# Patient Record
Sex: Female | Born: 1960 | Race: White | Hispanic: No | Marital: Married | Smoking: Never smoker
Health system: Southern US, Community
[De-identification: ages and names within clinical notes are randomized; demographics above are authoritative.]

## PROBLEM LIST (undated history)

## (undated) DIAGNOSIS — R002 Palpitations: Secondary | ICD-10-CM

## (undated) DIAGNOSIS — G4700 Insomnia, unspecified: Secondary | ICD-10-CM

## (undated) DIAGNOSIS — R9430 Abnormal result of cardiovascular function study, unspecified: Secondary | ICD-10-CM

## (undated) DIAGNOSIS — F410 Panic disorder [episodic paroxysmal anxiety] without agoraphobia: Secondary | ICD-10-CM

## (undated) DIAGNOSIS — IMO0002 Reserved for concepts with insufficient information to code with codable children: Secondary | ICD-10-CM

## (undated) DIAGNOSIS — R011 Cardiac murmur, unspecified: Secondary | ICD-10-CM

## (undated) DIAGNOSIS — I879 Disorder of vein, unspecified: Secondary | ICD-10-CM

## (undated) DIAGNOSIS — Z8249 Family history of ischemic heart disease and other diseases of the circulatory system: Secondary | ICD-10-CM

## (undated) HISTORY — DX: Insomnia, unspecified: G47.00

## (undated) HISTORY — DX: Abnormal result of cardiovascular function study, unspecified: R94.30

## (undated) HISTORY — PX: APPENDECTOMY: SHX54

## (undated) HISTORY — DX: Reserved for concepts with insufficient information to code with codable children: IMO0002

## (undated) HISTORY — DX: Disorder of vein, unspecified: I87.9

## (undated) HISTORY — DX: Palpitations: R00.2

## (undated) HISTORY — DX: Family history of ischemic heart disease and other diseases of the circulatory system: Z82.49

## (undated) HISTORY — DX: Cardiac murmur, unspecified: R01.1

## (undated) HISTORY — DX: Panic disorder (episodic paroxysmal anxiety): F41.0

---
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
## 1993-04-30 HISTORY — PX: OTHER SURGICAL HISTORY: SHX169

## 2000-02-09 ENCOUNTER — Encounter: Payer: Self-pay | Admitting: Obstetrics and Gynecology

## 2000-02-09 ENCOUNTER — Encounter: Admission: RE | Admit: 2000-02-09 | Discharge: 2000-02-09 | Payer: Self-pay | Admitting: Obstetrics and Gynecology

## 2001-02-11 ENCOUNTER — Encounter: Admission: RE | Admit: 2001-02-11 | Discharge: 2001-02-11 | Payer: Self-pay | Admitting: Obstetrics and Gynecology

## 2001-02-11 ENCOUNTER — Encounter: Payer: Self-pay | Admitting: Obstetrics and Gynecology

## 2002-02-16 ENCOUNTER — Encounter: Admission: RE | Admit: 2002-02-16 | Discharge: 2002-02-16 | Payer: Self-pay | Admitting: Obstetrics and Gynecology

## 2002-02-16 ENCOUNTER — Encounter: Payer: Self-pay | Admitting: Obstetrics and Gynecology

## 2002-10-28 ENCOUNTER — Ambulatory Visit (HOSPITAL_COMMUNITY): Admission: RE | Admit: 2002-10-28 | Discharge: 2002-10-28 | Payer: Self-pay | Admitting: Gastroenterology

## 2003-02-24 ENCOUNTER — Encounter: Admission: RE | Admit: 2003-02-24 | Discharge: 2003-02-24 | Payer: Self-pay | Admitting: Obstetrics and Gynecology

## 2004-06-21 ENCOUNTER — Ambulatory Visit (HOSPITAL_COMMUNITY): Admission: RE | Admit: 2004-06-21 | Discharge: 2004-06-21 | Payer: Self-pay | Admitting: Obstetrics and Gynecology

## 2005-07-12 ENCOUNTER — Ambulatory Visit (HOSPITAL_COMMUNITY): Admission: RE | Admit: 2005-07-12 | Discharge: 2005-07-12 | Payer: Self-pay | Admitting: Obstetrics and Gynecology

## 2006-08-01 ENCOUNTER — Ambulatory Visit (HOSPITAL_COMMUNITY): Admission: RE | Admit: 2006-08-01 | Discharge: 2006-08-01 | Payer: Self-pay | Admitting: Obstetrics and Gynecology

## 2006-08-29 ENCOUNTER — Inpatient Hospital Stay (HOSPITAL_COMMUNITY): Admission: RE | Admit: 2006-08-29 | Discharge: 2006-09-02 | Payer: Self-pay | Admitting: Neurosurgery

## 2007-11-26 ENCOUNTER — Encounter: Admission: RE | Admit: 2007-11-26 | Discharge: 2007-11-26 | Payer: Self-pay | Admitting: Obstetrics and Gynecology

## 2010-07-21 ENCOUNTER — Telehealth: Payer: Self-pay | Admitting: *Deleted

## 2010-07-21 DIAGNOSIS — R002 Palpitations: Secondary | ICD-10-CM

## 2010-07-21 NOTE — Telephone Encounter (Signed)
Dr Myrtis Ser received a call from pts pcp stating she was having a lot of palpitations, he would like for pt to have an echo and see him in the office as a new pt.  Spoke w/pt appts sch, echo 4/3 at 4pm and w/Dr Myrtis Ser 4/17 at 3pm

## 2010-07-31 ENCOUNTER — Other Ambulatory Visit (HOSPITAL_COMMUNITY): Payer: Self-pay | Admitting: Cardiology

## 2010-07-31 DIAGNOSIS — R002 Palpitations: Secondary | ICD-10-CM

## 2010-08-01 ENCOUNTER — Ambulatory Visit (HOSPITAL_COMMUNITY): Payer: BC Managed Care – PPO | Attending: Cardiology | Admitting: Radiology

## 2010-08-01 DIAGNOSIS — R002 Palpitations: Secondary | ICD-10-CM | POA: Insufficient documentation

## 2010-08-01 DIAGNOSIS — R012 Other cardiac sounds: Secondary | ICD-10-CM

## 2010-08-11 ENCOUNTER — Encounter: Payer: Self-pay | Admitting: Cardiology

## 2010-08-15 ENCOUNTER — Encounter: Payer: Self-pay | Admitting: Cardiology

## 2010-08-15 ENCOUNTER — Ambulatory Visit (INDEPENDENT_AMBULATORY_CARE_PROVIDER_SITE_OTHER): Payer: BC Managed Care – PPO | Admitting: Cardiology

## 2010-08-15 VITALS — BP 110/70 | HR 94 | Resp 12 | Ht 66.0 in | Wt 103.0 lb

## 2010-08-15 DIAGNOSIS — G4700 Insomnia, unspecified: Secondary | ICD-10-CM

## 2010-08-15 DIAGNOSIS — R011 Cardiac murmur, unspecified: Secondary | ICD-10-CM

## 2010-08-15 DIAGNOSIS — Z8249 Family history of ischemic heart disease and other diseases of the circulatory system: Secondary | ICD-10-CM | POA: Insufficient documentation

## 2010-08-15 DIAGNOSIS — R002 Palpitations: Secondary | ICD-10-CM | POA: Insufficient documentation

## 2010-08-15 DIAGNOSIS — Z8489 Family history of other specified conditions: Secondary | ICD-10-CM

## 2010-08-15 DIAGNOSIS — R9430 Abnormal result of cardiovascular function study, unspecified: Secondary | ICD-10-CM | POA: Insufficient documentation

## 2010-08-15 NOTE — Assessment & Plan Note (Signed)
The patient has felt some nighttime palpitations and some rare palpitations during the day.  I believe this is related to the stresses in her life.  We talked about the possibility of using a beta blocker on a p.r.n. Basis.  I talked about a Holter monitor but feel it is not indicated at this point.  I decided to watch her clinically.  Two-dimensional echo reveals normal LV function.  There are no significant valvular abnormalities.

## 2010-08-15 NOTE — Assessment & Plan Note (Signed)
The patient has a normal aortic root by echo.  I feel it is very unlikely that she has any type of aortic root disease.

## 2010-08-15 NOTE — Patient Instructions (Signed)
Your physician wants you to follow-up in: 1 year. You will receive a reminder letter in the mail two months in advance. If you don't receive a letter, please call our office to schedule the follow-up appointment.  

## 2010-08-15 NOTE — Assessment & Plan Note (Signed)
The patient has a mid to late systolic click on physical exam.  This probably is a mitral click.  There is no significant murmur.  There is no significant prolapse on her echo.  No further evaluation is needed.

## 2010-08-15 NOTE — Progress Notes (Addendum)
HPI The patient is seen in consultation for evaluation of palpitations.  She is very healthy active woman.  She has had significant stress.  Her husband wears CPAP and this does affect her sleeping.  She also worries about the family history.  Her mother had an ascending aortic aneurysm and eventually required an aortic valve an aneurysm repair.  At a later date this became infected and the patient did not survive resurgery.  She's not had any chest pain.  She has noted some palpitations in the middle of the night.  This has occurred  on 2 occasions while driving.  She has not had true syncope or syncope.  At this point the history does not seem to suggest true bursts of supraventricular tachycardia.  No Known Allergies  Current Outpatient Prescriptions  Medication Sig Dispense Refill  . aspirin 81 MG tablet Take 81 mg by mouth daily.        Marland Kitchen LORazepam (ATIVAN) 0.5 MG tablet Take 0.5 mg by mouth every 8 (eight) hours.        . Multiple Vitamin (MULTIVITAMIN) capsule occasional       . Polyethylene Glycol 3350 (MIRALAX PO) 1/2 of dose daily         History   Social History  . Marital Status: Married    Spouse Name: N/A    Number of Children: N/A  . Years of Education: N/A   Occupational History  . office manager    Social History Main Topics  . Smoking status: Never Smoker   . Smokeless tobacco: Not on file  . Alcohol Use: Yes     socially  . Drug Use: No  . Sexually Active: Not on file   Other Topics Concern  . Not on file   Social History Narrative  . No narrative on file    Family History  Problem Relation Age of Onset  . Aneurysm    . Hypertension      Past Medical History  Diagnosis Date  . Ejection fraction     60-65%, echo, August 01, 2010  . Palpitations     April, 2012  . Insomnia     Past Surgical History  Procedure Date  . Hysterectomy unknown type 1995  . Cesarean section 1985  . Tonsillectomy 1963    ROS  Patient denies fever, chills, headache,  sweats, rash, change in vision, change in hearing, chest pain, cough, nausea vomiting, urinary symptoms.  All other systems are reviewed and are negative  PHYSICAL EXAM Patient is a very healthy-appearing woman.  She is oriented to person time and place.  Affect is normal.  She is here with her husband.  Head is atraumatic.  There is no xanthelasma.  There is no jugular venous distention.  No carotid bruits. Lungs are clear.  Respiratory effort is nonlabored.  Cardiac exam reveals an S1-S2.  There is a late systolic click.  The abdomen is soft.  There is no peripheral edema.  There are no musculoskeletal deformities.  No skin rashes.  Filed Vitals:   08/15/10 1506  BP: 110/70  Pulse: 94  Resp: 12  Height: 5\' 6"  (1.676 m)  Weight: 103 lb (46.72 kg)    EKG Reveals nonspecific ST-T wave abnormalities..  This EKG is done today and reviewed by me.  ASSESSMENT & PLAN

## 2010-08-15 NOTE — Assessment & Plan Note (Signed)
The patient has been given a small amount of medicine to help with some nighttime anxiety.  This was given to her by her primary physician.  I suggested that she might try Benadryl to see if it helps at night.  Other sleep medicines could be tried through her primary physician.

## 2010-08-16 ENCOUNTER — Other Ambulatory Visit (INDEPENDENT_AMBULATORY_CARE_PROVIDER_SITE_OTHER): Payer: BC Managed Care – PPO | Admitting: *Deleted

## 2010-08-16 DIAGNOSIS — R002 Palpitations: Secondary | ICD-10-CM

## 2010-08-16 LAB — CBC WITH DIFFERENTIAL/PLATELET
Basophils Absolute: 0 10*3/uL (ref 0.0–0.1)
Eosinophils Absolute: 0.1 10*3/uL (ref 0.0–0.7)
Lymphocytes Relative: 28.3 % (ref 12.0–46.0)
MCHC: 34.6 g/dL (ref 30.0–36.0)
Monocytes Relative: 6 % (ref 3.0–12.0)
Neutrophils Relative %: 64.2 % (ref 43.0–77.0)
Platelets: 173 10*3/uL (ref 150.0–400.0)
RDW: 13 % (ref 11.5–14.6)

## 2010-08-16 LAB — BASIC METABOLIC PANEL
CO2: 33 mEq/L — ABNORMAL HIGH (ref 19–32)
Chloride: 102 mEq/L (ref 96–112)
Creatinine, Ser: 0.8 mg/dL (ref 0.4–1.2)
Sodium: 143 mEq/L (ref 135–145)

## 2010-08-16 LAB — TSH: TSH: 1.17 u[IU]/mL (ref 0.35–5.50)

## 2010-09-14 ENCOUNTER — Other Ambulatory Visit: Payer: Self-pay | Admitting: Neurosurgery

## 2010-09-14 DIAGNOSIS — M479 Spondylosis, unspecified: Secondary | ICD-10-CM

## 2010-09-15 NOTE — Op Note (Signed)
   NAME:  Alexis Lin, Alexis Lin                           ACCOUNT NO.:  [id number]   MEDICAL RECORD NO.:  [id number]                   PATIENT TYPE:  AMB   LOCATION:  ENDO                                 FACILITY:  [facility]   PHYSICIAN:  Petra Kuba, M.D.                 DATE OF BIRTH:  1960/09/14   DATE OF PROCEDURE:  10/28/2002  DATE OF DISCHARGE:                                 OPERATIVE REPORT   PROCEDURE:  Colonoscopy.   INDICATIONS FOR PROCEDURE:  History of colon polyps, due for colonic  screening.  Consent was signed after risks, benefits, methods, and options  were thoroughly discussed in the past.   MEDICATIONS:  Demerol 80, Versed 8.   DESCRIPTION OF PROCEDURE:  Rectal inspection is pertinent for external  hemorrhoids.  Small digital examination was negative.  Video pediatric  adjustable colonoscope was inserted and easily advanced around the colon to  the cecum. This did require some abdominal pressure and no position changes.  No abnormality was seen on insertion. The cecum was identified by the  appendiceal orifice and the ileocecal valve. The scope was slowly withdrawn.  On slow withdrawal through the colon, the prep was adequate. There was some  liquid stool that required washing and suctioning, but on slow withdrawal,  no polyp, lesions, masses, diverticuli, or other abnormalities were seen as  we slowly withdrew back to the rectum. Once back in the rectum, the scope  was retroflexed.  Anorectal confirmed the hemorrhoids seen on retroflexion.  They were tiny and small.  The scope was reinserted a short ways up the left  side of the colon. Air was suctioned and scope removed. The patient  tolerated the procedure well. There was no obvious immediate complications.   ENDOSCOPIC ASSESSMENT:  1. Internal and external hemorrhoids.  2. Otherwise within normal limits to the cecum.   PLAN:  Yearly rectals and guaiacs per Dr. Elana Alm. Happy to see back p.r.n.  Continue MiraLax  since it is helping. Repeat screening at age 50 unless  needed sooner p.r.n.                                               Petra Kuba, M.D.    MEM/MEDQ  D:  10/28/2002  T:  10/28/2002  Job:  [id number]   cc:   S. Kyra Manges, M.D.  [phone] [address]  [place]  [place] [address]  Fax: [phone]

## 2010-09-15 NOTE — Op Note (Signed)
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   PROCEDURE PERFORMED:  L5 Gill procedure and L5-S1 posterolateral  arthrodesis with radius posterior instrumentation and autograft with  microdissection.   SURGEON:  Hewitt Shorts, M.D.   ASSISTANT SURGEON:  Nelia Shi. Webb Silversmith, RN; and [employer].   ANESTHESIA:  General endotracheal.   INDICATIONS FOR PROCEDURE:  The patient is a 50 year old woman who has  had progressively worsening low back pain.  X-rays and MRI scan showed a  grade II spondylolisthesis, L5 and S1.  She has bilateral L5 pars  interarticularis defect.  She did have not have any radicular symptoms  and the decision was made to proceed with decompression and  stabilization.   We had initially planned on an L5 Gill procedure and an L5-S1  posterolateral interbody fusion and posterolateral arthrodesis.  However, intraoperatively it was found that it was quite difficult to  enter into the disk space and, in the absence of radicular symptoms, it  was felt that the benefit coming from the interbody fusion was not  sufficient to warrant the associated morbidity; and therefore we elected  to proceed solely with the posterolateral arthrodesis with posterior  instrumentation.   DESCRIPTION OF PROCEDURE:   The patient was brought to the operating room  and placed under general endotracheal anesthesia.  The patient was  turned onto the prone position.  The lumbar region was prepped with  Betadine and saline solution and draped in the sterile fashion.  The  midline was infiltrated with local anesthetic with epinephrine.  An x-  ray was taken and L5-S1 level identified, and then a midline incision  was made over the L5-S1 level and carried down through the subcutaneous  tissue.  Bipolar cautery and electrocautery were used to maintain  hemostasis.  Dissection was carried down to the lumbar fascia which was  incised bilaterally, and the paraspinal muscles were dissected from the  spinous process and lamina in the subperiosteal fashion.  The L4, L5 and  S1 spinous processes and lamina were identified, and an x-ray was taken  to confirm the localization and then dissection was carried out  laterally, exposing the transverse process at L5.  The extent of the  anterolisthesis was noted.  We then began to dissect around the loose  posterior elements of L5 and we were able to gradually mobilize this  posterior element which included the spinous process, the lamina  bilaterally,  as well as the inferior articular processes bilaterally;  was mobilized and removed, and this was subsequently cleaned of all soft  tissue and then morselized to later be implanted as autograft.  The  ligamentum flavum was carefully removed and the thecal sac identified.  We then identified the exiting S1 nerve roots and the S1 pedicles.  There was a moderate amount of pseudarthrosis in the region of the pars  defect and this was carefully dissected.  We identified the annulus of  the L5-S1 disk.  We identified the posterior aspect of the L5 vertebra  which was notably anterior to the posterior aspect of the S1 vertebra.  We identified the exiting L5 nerve roots and the L5 pedicles  bilaterally.   We continued the  decompression with magnification microdissection in  microsurgical technique.  We attempted to enter into the disk space but  found that because of the extensive listhesis and the pronounced  narrowing of the L5-S1 disk space, it was difficult to enter into the  disk space.  It was not felt that significant further decompression  would be achieved by performing a PLIF, and in the end it was felt that  the potential morbidity of the procedure outweighed the potential  benefits.   We therefore elected to proceed at this point with a posterolateral  arthrodesis with posterior instrumentation.  The C-arm fluoroscope was  draped and brought into the field to provide fluoroscopic guidance, and  then we identified the pedicle entry sites for the L5 and S1 vertebrae  bilaterally.  Each of the four pedicles was probed with a pedicle probe,  examined with a ball probe, no cutouts were found.  Each of the pedicles  was then tapped with a 5.25 mm tap, again examined with a ball probe,  again no cutouts were found and good threading was noted.  Then we  placed pedicle screws bilaterally using five 0.75 mm screws, placing 45  mm screws at L5 and 30 mm screws at S1.  The extra length of the L5  screws allowed [place] to align the screw heads for placement of the rod.  We  had exposed the transverse processes of L5.  These were decorticated.  We also decorticated the superior articular process of S1 and the ala of  S1, and then packed morselized locally harvested autograft in the  lateral gutters from the transverse processes of L5 to the ala of S1,  and then used 30 mm straight rods.  These were placed on the screw heads  and secured with locking caps.  All four locking caps were tightened  again with counter-torque for final tightening.   We again examined the spinal canal, thecal sac and nerve roots.  They  appeared decompressed.  There is no bone graft material within the spinal canal.  The wound was  irrigated with saline and bacitracin  solution numerous times during the procedure.  Once the fusion construct  was completed, we proceeded with closure.  The deep fascia was closed  with interrupted undyed #1 Vicryl sutures; Scarpa's fascia was closed  with interrupted undyed #1 Vicryl sutures; the subcutaneous and  subcuticular layers were closed with interrupted inverted 2-0 endo  Vicryl sutures; and the skin was reapproximated with Dermabond.  The  wound was dressed with Adaptic and sterile gauze and Hypafix.  The  procedure was tolerated well.  Following surgery, the patient is to be  turned back to the supine position, reversed from the anesthetic,  extubated, and transferred to the recovery room for further care.      Hewitt Shorts, M.D.  Electronically Signed     RWN/MEDQ  D:  08/29/2006  T:  08/29/2006  Job:  [id number]

## 2010-09-15 NOTE — Discharge Summary (Signed)
[name], [name]:  [id number]   MEDICAL RECORD NO.:  [id number]          PATIENT TYPE:  INP   LOCATION:  3018                         FACILITY:  [facility]   PHYSICIAN:  Hewitt Shorts, M.D.DATE OF BIRTH:  1961-04-20   DATE OF ADMISSION:  08/29/2006  DATE OF DISCHARGE:  09/02/2006                               DISCHARGE SUMMARY   ADMISSION HISTORY AND PHYSICAL EXAMINATION:  The patient is a 50-year-  old woman who presented with low back pain.  She did not have radicular  symptoms.  X-rays and MRI scan revealed a grade II spondylolisthesis of  L5 and S1 secondary to bilateral L5 pars interarticularis defect.  Decision made to proceed with decompression and stabilization.  General  examination was unremarkable.  Neurologic examination showed intact  strength and sensation.   HOSPITAL COURSE:  The patient was admitted and underwent an L5 Gill  procedure and L5-S1 posterior lateral arthrodesis with radius posterior  instrumentation, autographed and microdissection.  Postoperatively, she  had a moderate amount of left lumbar radicular pain that has gradually  abated.  She was started on Lyrica 75 mg b.i.d.  Her wound is healing  well.  She is afebrile.  She is up and ambulating.  She is ready to be  discharged home today.  We have given her 3 prescriptions and also  advised her to take Aleve 2 tablets b.i.d. for the first week, and then  p.r.n. thereafter.  She was given a prescription for Percocet 5/325 one  or two every four to six hours p.r.n. pain, 60 tablets prescribed with  no refills.  Flexeril 10 mg t.i.d. p.r.n. muscle spasms, 90 tablets  prescribed with one refill.  Lyrica 75 mg b.i.d., 60 tablets prescribed  and three refills.   DISCHARGE DIAGNOSIS:  Grade II spondylolisthesis L5-S1 secondary  bilateral L5 pars interarticularis defect with low back pain, associated  lumbar spondylodesis and degenerative disk disease.      Hewitt Shorts,  M.D.  Electronically Signed     RWN/MEDQ  D:  09/02/2006  T:  09/02/2006  Job:  [id number]

## 2010-09-15 NOTE — H&P (Signed)
NAMEHADJA, Alexis Lin NO.:  [id number]   MEDICAL RECORD NO.:  [id number]          PATIENT TYPE:  INP   LOCATION:  3018                         FACILITY:  [facility]   PHYSICIAN:  Hewitt Shorts, M.D.DATE OF BIRTH:  [birth year]   DATE OF ADMISSION:  08/29/2006  DATE OF DISCHARGE:  09/02/2006                              HISTORY & PHYSICAL   HISTORY OF PRESENT ILLNESS:  The patient is a 50 year old white woman  who was seen for evaluation of low back pain.  She is a grade II  spondylolisthesis at L5, S1 secondarily bilateral L5 pars  interarticularis defect.  She has a history of low back pain for many  years.  It has been worse over the past 5 years.  She has had increased  difficulty sleeping.  She did not describe any radicular pain, weakness,  numbness or paresthesias.  Patient was treated with physical therapy  which actually aggravated the pain.  The patient was admitted now for  decompression stabilization.   PAST MEDICAL HISTORY:  She denies any history of hypertension,  mycoardial infarction, cancer, stroke, diabetes, peptic ulcer disease or  lung disease.   PAST SURGICAL HISTORY:  Includes a hysterectomy 1995, Caesarean section  1985, tonsillectomy 1963.   ALLERGIES:  She denies allergies to medications.   MEDICATIONS AT TIME OF ADMISSION:  Included MiraLax 1 tablespoon daily.   FAMILY HISTORY:  Mother is in good health at age 39 with heart valve  disease.  Father is in good health at age 2 with prostate cancer.   SOCIAL HISTORY:  Patient is married.  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.  She does not smoke.  She does drink  alcoholic beverages socially.  She denies history of substance abuse.   REVIEW OF SYSTEMS:  Notable for that which is described in history of  present illness and past medical history.  Review of systems otherwise  unremarkable.   PHYSICAL EXAMINATION:  Patient is a well developed, well nourished  white  female in no acute distress.  Temperature 98.  Pulse 89.  Blood pressure  121/82.  Respiratory rate 18.  Height 5' 1.  Weight 170 pounds.  LUNGS:  Clear to auscultation.  She has symmetric respiratory excursion.  HEART:  Regular rate and rhythm with S1, S2, no murmur.  ABDOMEN:  Soft, nondistended.  Bowel sounds present.  EXTREMITIES:  No clubbing, cyanosis or edema.  MUSCULOSKELETAL:  Shows mild diffused discomfort to palpation in the  lumbar region without specific point tenderness.  She has flexion to 90  degrees but has discomfort with extension at 5 degrees or less.  Straight leg raising is negative bilaterally.  NEUROLOGIC EXAMINATION:  Shows 5/5 strength to lower extremities  including the iliopsoas, quadriceps, dorsiflexion, extensor hallucis  longus and plantar flexors bilaterally.  Sensation is intact to pinprick  to the upper and lower extremities.  Reflexes are 2 at the biceps,  brachialis, triceps, quadriceps, gastrocnemius and are symmetrical  bilaterally.  Toes are downgoing bilaterally.  She has a normal gait and  stent.  IMPRESSION:  A 50 year old woman with a long history of low back pain  which has been worsening.  X-rays and MRI scan show a grade II  spondylolisthesis at L5 and S1 secondary to a bilateral L5 pars  interarticularis defect.  She is neurologically intact and denies any  radicular symptoms.   PLAN:  Patient will be admitted for an L5 Gill procedure, a bilateral L5-  S1 posterior lumbar interbody fusion with interbody implants and bone  graft and bilateral L5-S1 posterolateral arthrodesis with posterior  instrumentation and bone graft.   We discussed the nature of the surgery, alternatives to surgery, typical  length of surgery, hospital stay and overall recuperation, limitations  postoperatively, need for postoperative immobilization with a lumbar  corset and the plan to use a Cell Saver during surgery.  We discussed  the risks of surgery  including the risks of infection, bleeding,  possibility of transfusion, risk of nerve root dysfunction with pain,  weakness, numbness, paresthesias, the risk of dural tears and CSF  leakage, possible need for further surgery, the risk of failure of the  arthrodesis, and possible need for further surgery and anesthetic risk  for mycoardial infarction, stroke, pneumonia or death.  Understanding  all of this, she would like to go ahead with surgery and was admitted  for such.      Hewitt Shorts, M.D.  Electronically Signed     RWN/MEDQ  D:  09/02/2006  T:  09/02/2006  Job:  [id number]

## 2010-09-21 ENCOUNTER — Ambulatory Visit
Admission: RE | Admit: 2010-09-21 | Discharge: 2010-09-21 | Disposition: A | Discharge status: Home / Self Care | Payer: BC Managed Care – PPO | Source: Ambulatory Visit | Attending: Neurosurgery | Admitting: Neurosurgery

## 2010-09-21 DIAGNOSIS — M479 Spondylosis, unspecified: Secondary | ICD-10-CM

## 2010-09-21 MED ORDER — GADOBENATE DIMEGLUMINE 529 MG/ML IV SOLN
9.0000 mL | Freq: Once | INTRAVENOUS | Status: AC | PRN
  Administered 2010-09-21: 9 mL via INTRAVENOUS

## 2011-01-22 ENCOUNTER — Other Ambulatory Visit: Payer: BC Managed Care – PPO

## 2011-01-22 ENCOUNTER — Ambulatory Visit
Admission: RE | Admit: 2011-01-22 | Discharge: 2011-01-22 | Disposition: A | Discharge status: Home / Self Care | Payer: BC Managed Care – PPO | Source: Ambulatory Visit | Attending: Family Medicine | Admitting: Family Medicine

## 2011-01-22 ENCOUNTER — Other Ambulatory Visit: Payer: Self-pay | Admitting: Family Medicine

## 2011-01-22 DIAGNOSIS — M79605 Pain in left leg: Secondary | ICD-10-CM

## 2011-09-27 ENCOUNTER — Ambulatory Visit (INDEPENDENT_AMBULATORY_CARE_PROVIDER_SITE_OTHER): Payer: BC Managed Care – PPO | Admitting: Cardiology

## 2011-09-27 ENCOUNTER — Encounter: Payer: Self-pay | Admitting: Cardiology

## 2011-09-27 VITALS — BP 116/60 | HR 95 | Ht 61.5 in | Wt 99.0 lb

## 2011-09-27 DIAGNOSIS — G4700 Insomnia, unspecified: Secondary | ICD-10-CM

## 2011-09-27 DIAGNOSIS — R011 Cardiac murmur, unspecified: Secondary | ICD-10-CM

## 2011-09-27 DIAGNOSIS — Z8249 Family history of ischemic heart disease and other diseases of the circulatory system: Secondary | ICD-10-CM

## 2011-09-27 DIAGNOSIS — I879 Disorder of vein, unspecified: Secondary | ICD-10-CM | POA: Insufficient documentation

## 2011-09-27 DIAGNOSIS — Z8489 Family history of other specified conditions: Secondary | ICD-10-CM

## 2011-09-27 DIAGNOSIS — R002 Palpitations: Secondary | ICD-10-CM

## 2011-09-27 DIAGNOSIS — I999 Unspecified disorder of circulatory system: Secondary | ICD-10-CM

## 2011-09-27 DIAGNOSIS — F410 Panic disorder [episodic paroxysmal anxiety] without agoraphobia: Secondary | ICD-10-CM

## 2011-09-27 NOTE — Assessment & Plan Note (Signed)
The patient does have a late systolic click but there is no murmur and no mitral valve prolapse by echo. No further workup is needed.

## 2011-09-27 NOTE — Assessment & Plan Note (Signed)
The patient has no evidence of aortic valve disease or any disease of her aorta. Over time we will carefully follow this and assess.

## 2011-09-27 NOTE — Assessment & Plan Note (Signed)
The patient is receiving medications and is sleeping better.

## 2011-09-27 NOTE — Patient Instructions (Signed)
Your physician wants you to follow-up in: 1 year. You will receive a reminder letter in the mail two months in advance. If you don't receive a letter, please call our office to schedule the follow-up appointment.  

## 2011-09-27 NOTE — Assessment & Plan Note (Signed)
Patient has had some panic attacks. She is well aware of this. She is being treated by her primary physician.

## 2011-09-27 NOTE — Assessment & Plan Note (Signed)
The patient has some very mild superficial venous changes in her ankle. There is no major abnormality. I will help her with a referral to have this assessed.

## 2011-09-27 NOTE — Assessment & Plan Note (Signed)
The patient has no documented arrhythmias. She feels poorly when she becomes anxious. At this point I feel there is no need for her to be monitored.

## 2011-09-27 NOTE — Progress Notes (Signed)
HPI The patient is seen for followup of palpitations. I had seen her April, 2012. She has a late systolic click on physical exam.However there is no murmur and there is no mitral valve prolapse on her echo. She has normal left ventricular function. She's never had syncope or presyncope. She does worry because her mother had an aneurysm of the ascending aorta. This eventually required valve surgery and aneurysm repair. Unfortunately  Her mother's problem was complicated and she developed an infection and did not survive.  The patient has been under significant stress. One of her 2 daughters has had a significant illness. The patient is getting excellent care from her primary physician. She has had some panic attacks.  No Known Allergies  Current Outpatient Prescriptions  Medication Sig Dispense Refill  . escitalopram (LEXAPRO) 10 MG tablet Take 1 tablet by mouth Daily.      Marland Kitchen LORazepam (ATIVAN) 0.5 MG tablet Take 0.5 mg by mouth every 8 (eight) hours.        . meloxicam (MOBIC) 15 MG tablet Take 15 mg by mouth daily.       . Multiple Vitamin (MULTIVITAMIN) capsule occasional       . Polyethylene Glycol 3350 (MIRALAX PO) 1/2 of dose daily       . zolpidem (AMBIEN CR) 6.25 MG CR tablet Take 6.25 mg by mouth at bedtime as needed.         History   Social History  . Marital Status: Married    Spouse Name: N/A    Number of Children: N/A  . Years of Education: N/A   Occupational History  . office manager    Social History Main Topics  . Smoking status: Never Smoker   . Smokeless tobacco: Not on file  . Alcohol Use: Yes     socially  . Drug Use: No  . Sexually Active: Not on file   Other Topics Concern  . Not on file   Social History Narrative  . No narrative on file    Family History  Problem Relation Age of Onset  . Aneurysm    . Hypertension      Past Medical History  Diagnosis Date  . Ejection fraction     60-65%, echo, August 01, 2010  . Palpitations     April,  2012  . Insomnia   . Mitral click     Late systolic click on physical exam, April, 2012,.  There is no murmur.  There is no mitral valve prolapse by echo  . Family history of aneurysm     The patient's mother had an ascending aortic aneurysm.  There was no specific pathology.  The aortic valve was effected .    Past Surgical History  Procedure Date  . Hysterectomy unknown type 1995  . Cesarean section 1985  . Tonsillectomy 1963    ROS Patient denies fever, chills, headache, sweats, rash, change in vision, change in hearing, cough, nausea vomiting, urinary symptoms. All other systems are reviewed and are negative.  PHYSICAL EXAM  Patient is oriented to person time and place. Affect is normal. There is no jugulovenous distention. Lungs are clear. Respiratory effort is nonlabored. Cardiac exam reveals S1-S2. There is a late systolic click. There is no murmur. The abdomen is soft. There is no peripheral edema.  Filed Vitals:   09/27/11 1417  BP: 116/60  Pulse: 95  Height: 5' 1.5" (1.562 m)  Weight: 99 lb (44.906 kg)   EKG is done today  and reviewed by me. It is normal. There is no change from the past.  ASSESSMENT & PLAN

## 2011-10-02 NOTE — Progress Notes (Signed)
Addended by: Reine Just on: 10/02/2011 08:04 PM   Modules accepted: Orders

## 2012-01-24 ENCOUNTER — Other Ambulatory Visit: Payer: Self-pay | Admitting: Obstetrics and Gynecology

## 2012-01-24 DIAGNOSIS — R928 Other abnormal and inconclusive findings on diagnostic imaging of breast: Secondary | ICD-10-CM

## 2012-01-29 ENCOUNTER — Ambulatory Visit
Admission: RE | Admit: 2012-01-29 | Discharge: 2012-01-29 | Disposition: A | Discharge status: Home / Self Care | Payer: BC Managed Care – PPO | Source: Ambulatory Visit | Attending: Obstetrics and Gynecology | Admitting: Obstetrics and Gynecology

## 2012-01-29 DIAGNOSIS — R928 Other abnormal and inconclusive findings on diagnostic imaging of breast: Secondary | ICD-10-CM

## 2013-08-12 IMAGING — US US BREAST*L*
1 series · 4 of 4 positions shown · non-contrast
Comparison: 01/21/2012, 01/16/2011, dating back to 07/12/2005.

CLINICAL DATA: Called back from screening mammography, possible
left breast mass.

DIGITAL DIAGNOSTIC LEFT MAMMOGRAM  AND LEFT BREAST ULTRASOUND:

[Series 1: us breast*left* · 4 of 4 slices shown]
[im 1/4]
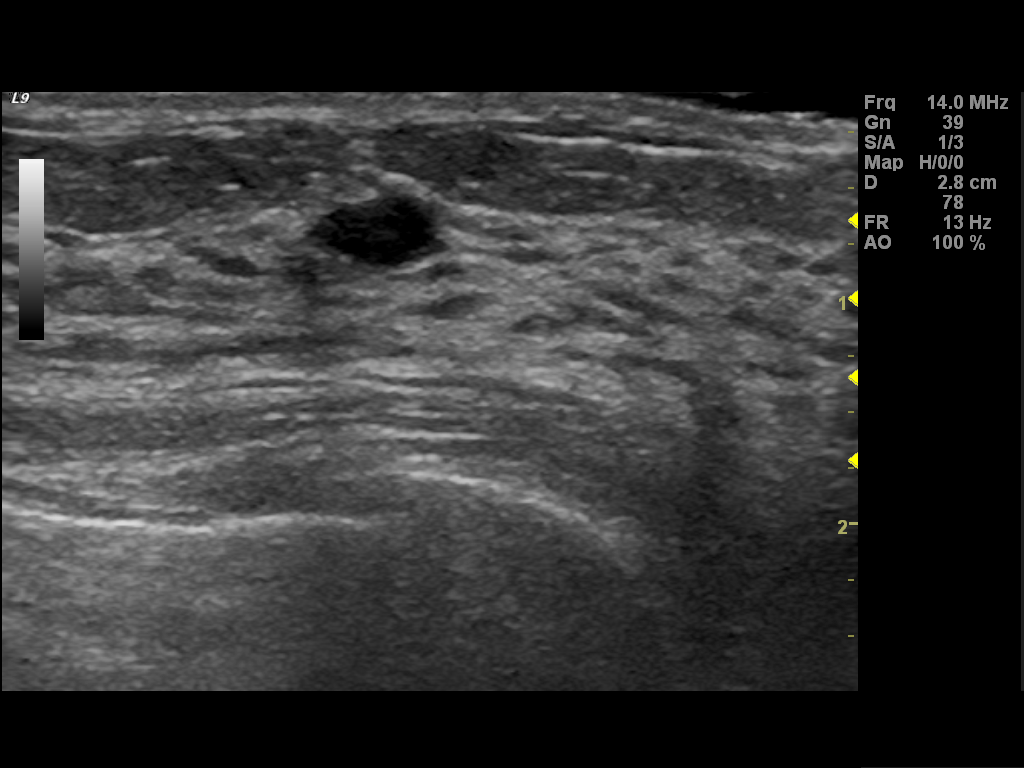
[im 2/4]
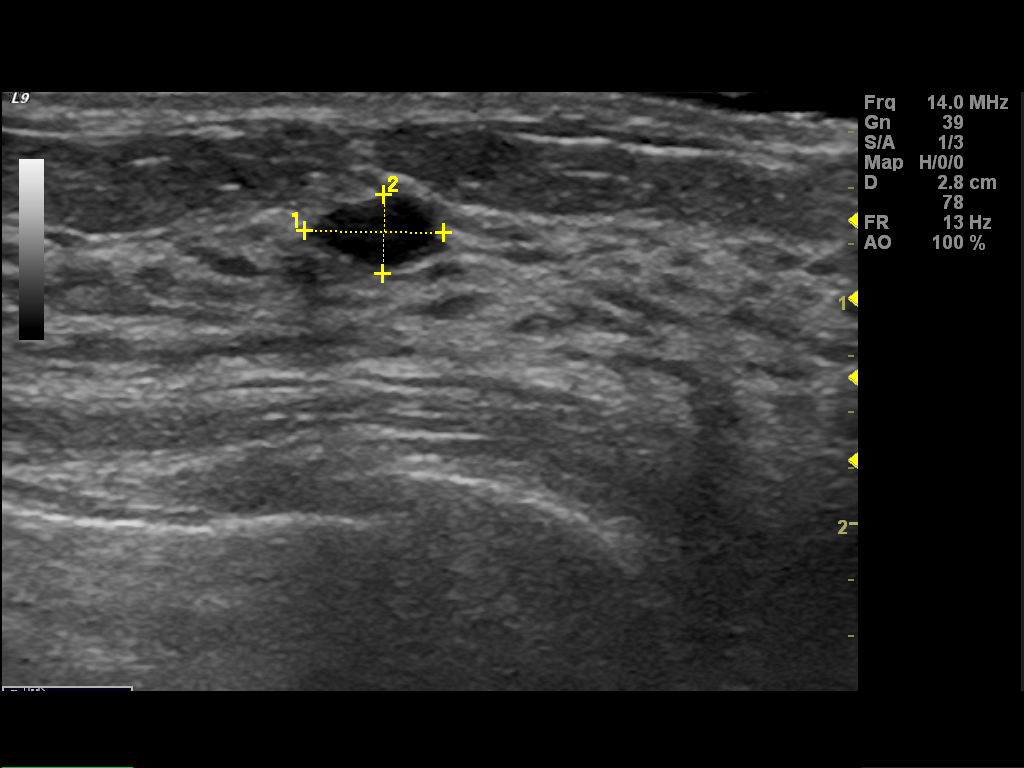
[im 3/4]
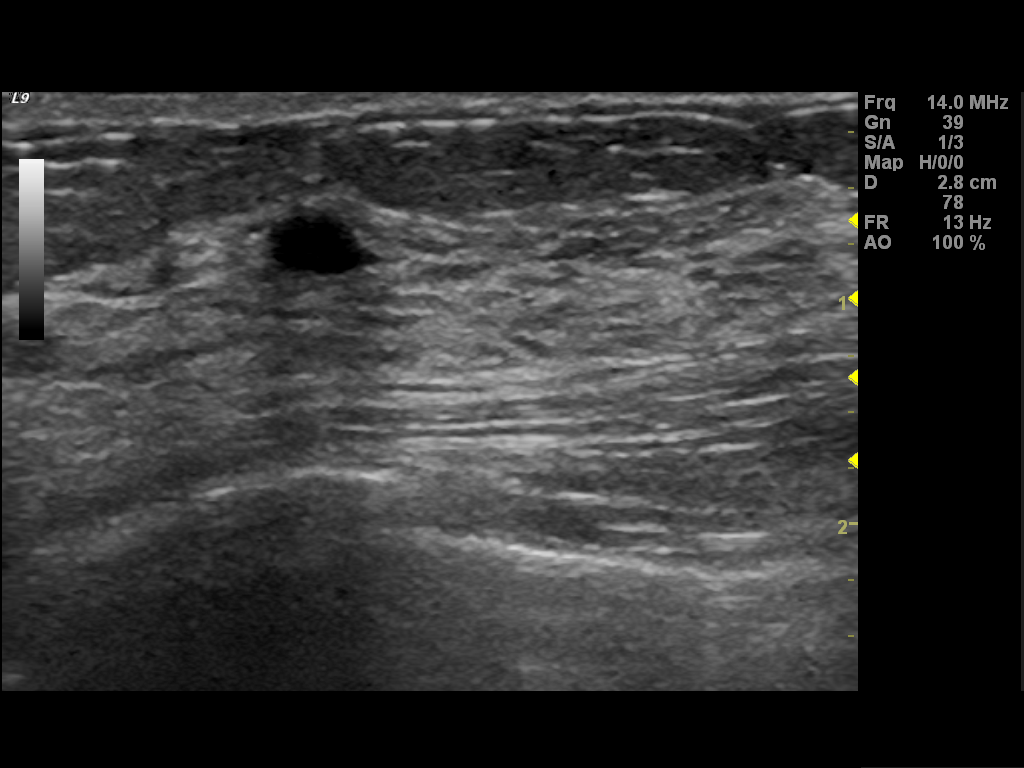
[im 4/4]
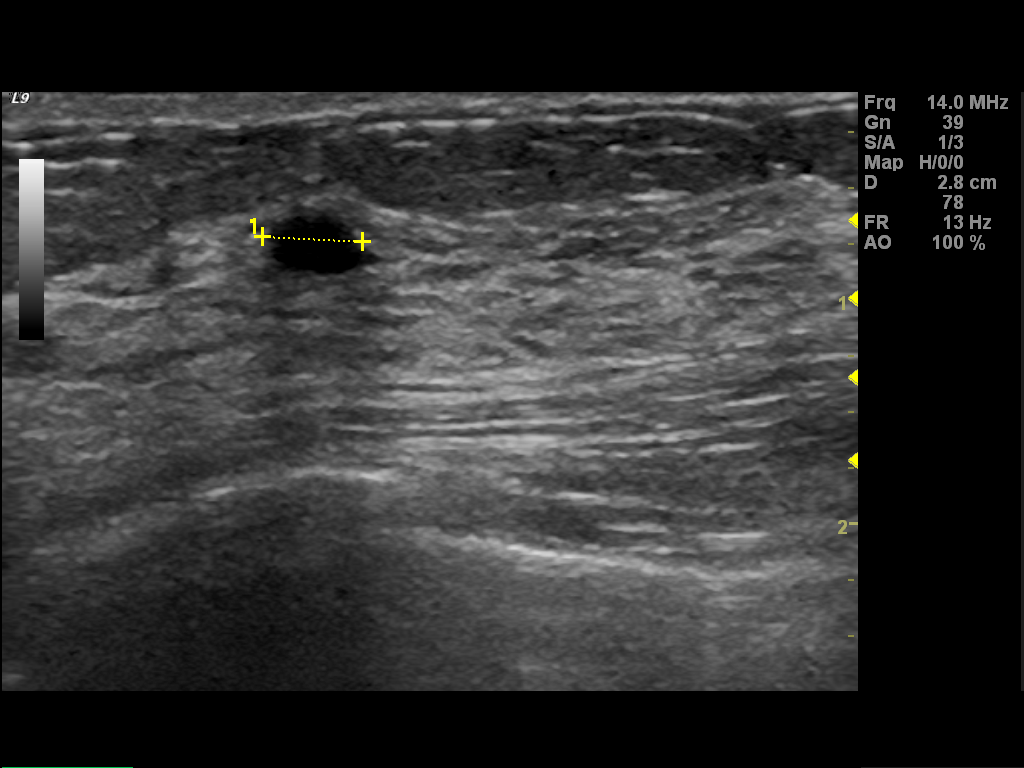

[4 of 4 positions shown; findings below may reference images not displayed]

FINDINGS: Spot compression CC and MLO views of the left breast in
the area of concern on screening mammography were obtained.  These
confirm a circumscribed, partially obscured mass in the upper left
breast approximately 3 cm from the nipple.

On physical exam, I palpate no mass in the upper left breast.

Ultrasound is performed, showing an oval shaped horizontally
oriented anechoic mass with acoustic enhancement at the 12 o'clock
position of the left breast, 3 cm from the nipple, measuring 0.5 x
0.4 x 0.6 cm, corresponding to the area of concern on mammography.
No solid mass or abnormal acoustic shadowing was identified.
IMPRESSION: Simple cyst at the 12 o'clock position of the left breast, 3 cm
from the nipple, corresponding to the area of concern on screening
mammography.  No mammographic or sonographic evidence of
malignancy, left breast.

RECOMMENDATION:
Screening mammogram in one year. (Code:8T-D-2XV)

The patient was encouraged to perform monthly self breast
examination and communicate any changes with her primary physician.
Findings and recommendations were discussed with the patient and
provided in written form at the time of the exam.

BI-RADS CATEGORY 2:  Benign finding(s).

## 2016-05-15 ENCOUNTER — Telehealth: Payer: Self-pay

## 2016-05-15 NOTE — Telephone Encounter (Signed)
Patient called - I returned a called to her and left VM.

## 2017-11-28 ENCOUNTER — Emergency Department (HOSPITAL_COMMUNITY): Payer: No Typology Code available for payment source | Admitting: Certified Registered"

## 2017-11-28 ENCOUNTER — Ambulatory Visit (HOSPITAL_COMMUNITY)
Admission: EM | Disposition: A | Discharge status: Home / Self Care | Payer: Self-pay | Source: Home / Self Care | Attending: Emergency Medicine

## 2017-11-28 ENCOUNTER — Emergency Department (HOSPITAL_COMMUNITY): Payer: No Typology Code available for payment source

## 2017-11-28 ENCOUNTER — Observation Stay (HOSPITAL_COMMUNITY)
Admission: EM | Admit: 2017-11-28 | Discharge: 2017-11-29 | Disposition: A | Discharge status: Home / Self Care | Payer: No Typology Code available for payment source | Attending: Neurosurgery | Admitting: Neurosurgery

## 2017-11-28 ENCOUNTER — Encounter (HOSPITAL_COMMUNITY): Payer: Self-pay

## 2017-11-28 DIAGNOSIS — M5442 Lumbago with sciatica, left side: Secondary | ICD-10-CM | POA: Diagnosis present

## 2017-11-28 DIAGNOSIS — G4700 Insomnia, unspecified: Secondary | ICD-10-CM | POA: Diagnosis not present

## 2017-11-28 DIAGNOSIS — Z419 Encounter for procedure for purposes other than remedying health state, unspecified: Secondary | ICD-10-CM

## 2017-11-28 DIAGNOSIS — M5126 Other intervertebral disc displacement, lumbar region: Secondary | ICD-10-CM | POA: Diagnosis present

## 2017-11-28 DIAGNOSIS — M5116 Intervertebral disc disorders with radiculopathy, lumbar region: Secondary | ICD-10-CM | POA: Diagnosis not present

## 2017-11-28 DIAGNOSIS — Z79899 Other long term (current) drug therapy: Secondary | ICD-10-CM | POA: Diagnosis not present

## 2017-11-28 HISTORY — PX: LUMBAR LAMINECTOMY/DECOMPRESSION MICRODISCECTOMY: SHX5026

## 2017-11-28 LAB — CBC WITH DIFFERENTIAL/PLATELET
Abs Immature Granulocytes: 0.1 10*3/uL (ref 0.0–0.1)
BASOS PCT: 0 %
Basophils Absolute: 0 10*3/uL (ref 0.0–0.1)
EOS ABS: 0 10*3/uL (ref 0.0–0.7)
EOS PCT: 0 %
HEMATOCRIT: 43.7 % (ref 36.0–46.0)
Hemoglobin: 14.8 g/dL (ref 12.0–15.0)
IMMATURE GRANULOCYTES: 1 %
LYMPHS ABS: 1.1 10*3/uL (ref 0.7–4.0)
Lymphocytes Relative: 7 %
MCH: 30.9 pg (ref 26.0–34.0)
MCHC: 33.9 g/dL (ref 30.0–36.0)
MCV: 91.2 fL (ref 78.0–100.0)
MONO ABS: 1 10*3/uL (ref 0.1–1.0)
MONOS PCT: 6 %
NEUTROS PCT: 86 %
Neutro Abs: 13.8 10*3/uL — ABNORMAL HIGH (ref 1.7–7.7)
PLATELETS: 237 10*3/uL (ref 150–400)
RBC: 4.79 MIL/uL (ref 3.87–5.11)
RDW: 11.9 % (ref 11.5–15.5)
WBC: 16 10*3/uL — ABNORMAL HIGH (ref 4.0–10.5)

## 2017-11-28 LAB — BASIC METABOLIC PANEL
Anion gap: 15 (ref 5–15)
BUN: 15 mg/dL (ref 6–20)
CO2: 21 mmol/L — ABNORMAL LOW (ref 22–32)
CREATININE: 0.85 mg/dL (ref 0.44–1.00)
Calcium: 9.2 mg/dL (ref 8.9–10.3)
Chloride: 104 mmol/L (ref 98–111)
GFR calc Af Amer: >60 mL/min (ref >60)
GLUCOSE: 150 mg/dL — AB (ref 70–99)
Potassium: 2.8 mmol/L — ABNORMAL LOW (ref 3.5–5.1)
SODIUM: 140 mmol/L (ref 135–145)

## 2017-11-28 SURGERY — LUMBAR LAMINECTOMY/DECOMPRESSION MICRODISCECTOMY 2 LEVELS
Anesthesia: Choice | Laterality: Left

## 2017-11-28 MED ORDER — LACTATED RINGERS IV SOLN
INTRAVENOUS | Status: DC
  Administered 2017-11-28: 16:00:00 via INTRAVENOUS

## 2017-11-28 MED ORDER — LIDOCAINE 2% (20 MG/ML) 5 ML SYRINGE
INTRAMUSCULAR | Status: DC | PRN
  Administered 2017-11-28: 40 mg via INTRAVENOUS

## 2017-11-28 MED ORDER — MAGNESIUM HYDROXIDE 400 MG/5ML PO SUSP: 30.0000 mL | Freq: Every day | ORAL | Status: DC | PRN

## 2017-11-28 MED ORDER — 0.9 % SODIUM CHLORIDE (POUR BTL) OPTIME
TOPICAL | Status: DC | PRN
  Administered 2017-11-28: 1000 mL

## 2017-11-28 MED ORDER — ARTIFICIAL TEARS OPHTHALMIC OINT
TOPICAL_OINTMENT | OPHTHALMIC | Status: DC | PRN
  Administered 2017-11-28: 1 via OPHTHALMIC

## 2017-11-28 MED ORDER — SUCCINYLCHOLINE CHLORIDE 200 MG/10ML IV SOSY
PREFILLED_SYRINGE | INTRAVENOUS | Status: AC
  Filled 2017-11-28: qty 10

## 2017-11-28 MED ORDER — MORPHINE SULFATE (PF) 4 MG/ML IV SOLN: 4.0000 mg | INTRAVENOUS | Status: DC | PRN

## 2017-11-28 MED ORDER — METHYLPREDNISOLONE ACETATE 80 MG/ML IJ SUSP
INTRAMUSCULAR | Status: DC | PRN
  Administered 2017-11-28: 80 mg

## 2017-11-28 MED ORDER — LIDOCAINE-EPINEPHRINE 1 %-1:100000 IJ SOLN
INTRAMUSCULAR | Status: DC | PRN
  Administered 2017-11-28: 10 mL via INTRADERMAL

## 2017-11-28 MED ORDER — CYCLOBENZAPRINE HCL 5 MG PO TABS
5.0000 mg | ORAL_TABLET | Freq: Three times a day (TID) | ORAL | Status: DC | PRN
  Administered 2017-11-28: 5 mg via ORAL
  Filled 2017-11-28: qty 1

## 2017-11-28 MED ORDER — ONDANSETRON HCL 4 MG/2ML IJ SOLN
INTRAMUSCULAR | Status: DC | PRN
  Administered 2017-11-28: 4 mg via INTRAVENOUS

## 2017-11-28 MED ORDER — THROMBIN 20000 UNITS EX SOLR
CUTANEOUS | Status: DC | PRN
  Administered 2017-11-28: 17:00:00 via TOPICAL

## 2017-11-28 MED ORDER — KETOROLAC TROMETHAMINE 30 MG/ML IJ SOLN
INTRAMUSCULAR | Status: AC
  Filled 2017-11-28: qty 1

## 2017-11-28 MED ORDER — METHYLPREDNISOLONE ACETATE 80 MG/ML IJ SUSP
INTRAMUSCULAR | Status: AC
  Filled 2017-11-28: qty 1

## 2017-11-28 MED ORDER — FLEET ENEMA 7-19 GM/118ML RE ENEM: 1.0000 | ENEMA | Freq: Once | RECTAL | Status: DC | PRN

## 2017-11-28 MED ORDER — SODIUM CHLORIDE 0.9 % IV SOLN
INTRAVENOUS | Status: DC | PRN
  Administered 2017-11-28: 17:00:00

## 2017-11-28 MED ORDER — ALUM & MAG HYDROXIDE-SIMETH 200-200-20 MG/5ML PO SUSP: 30.0000 mL | Freq: Four times a day (QID) | ORAL | Status: DC | PRN

## 2017-11-28 MED ORDER — FENTANYL CITRATE (PF) 250 MCG/5ML IJ SOLN
INTRAMUSCULAR | Status: DC | PRN
  Administered 2017-11-28 (×2): 50 ug via INTRAVENOUS
  Administered 2017-11-28: 100 ug via INTRAVENOUS

## 2017-11-28 MED ORDER — BUPIVACAINE HCL (PF) 0.5 % IJ SOLN
INTRAMUSCULAR | Status: DC | PRN
  Administered 2017-11-28: 10 mL

## 2017-11-28 MED ORDER — SUGAMMADEX SODIUM 200 MG/2ML IV SOLN
INTRAVENOUS | Status: AC
  Filled 2017-11-28: qty 2

## 2017-11-28 MED ORDER — LIDOCAINE-EPINEPHRINE 1 %-1:100000 IJ SOLN
INTRAMUSCULAR | Status: AC
Start: 2017-11-28 — End: ?
  Filled 2017-11-28: qty 1

## 2017-11-28 MED ORDER — BISACODYL 10 MG RE SUPP: 10.0000 mg | Freq: Every day | RECTAL | Status: DC | PRN

## 2017-11-28 MED ORDER — PHENYLEPHRINE 40 MCG/ML (10ML) SYRINGE FOR IV PUSH (FOR BLOOD PRESSURE SUPPORT)
PREFILLED_SYRINGE | INTRAVENOUS | Status: DC | PRN
  Administered 2017-11-28 (×2): 80 ug via INTRAVENOUS

## 2017-11-28 MED ORDER — HYDROCODONE-ACETAMINOPHEN 5-325 MG PO TABS
1.0000 | ORAL_TABLET | ORAL | Status: DC | PRN
  Administered 2017-11-28 – 2017-11-29 (×2): 1 via ORAL
  Filled 2017-11-28 (×2): qty 1

## 2017-11-28 MED ORDER — HYDROMORPHONE HCL 1 MG/ML IJ SOLN: 0.2500 mg | INTRAMUSCULAR | Status: DC | PRN

## 2017-11-28 MED ORDER — ACETAMINOPHEN 10 MG/ML IV SOLN
INTRAVENOUS | Status: AC
  Filled 2017-11-28: qty 100

## 2017-11-28 MED ORDER — KCL IN DEXTROSE-NACL 40-5-0.45 MEQ/L-%-% IV SOLN
INTRAVENOUS | Status: DC
  Filled 2017-11-28: qty 1000

## 2017-11-28 MED ORDER — ROCURONIUM BROMIDE 10 MG/ML (PF) SYRINGE
PREFILLED_SYRINGE | INTRAVENOUS | Status: DC | PRN
  Administered 2017-11-28: 50 mg via INTRAVENOUS

## 2017-11-28 MED ORDER — HYDROXYZINE HCL 50 MG/ML IM SOLN: 50.0000 mg | INTRAMUSCULAR | Status: DC | PRN

## 2017-11-28 MED ORDER — MIDAZOLAM HCL 2 MG/2ML IJ SOLN
INTRAMUSCULAR | Status: DC | PRN
  Administered 2017-11-28: 2 mg via INTRAVENOUS

## 2017-11-28 MED ORDER — ACETAMINOPHEN 325 MG PO TABS: 650.0000 mg | ORAL_TABLET | ORAL | Status: DC | PRN

## 2017-11-28 MED ORDER — LIDOCAINE 2% (20 MG/ML) 5 ML SYRINGE
INTRAMUSCULAR | Status: AC
  Filled 2017-11-28: qty 5

## 2017-11-28 MED ORDER — DEXAMETHASONE SODIUM PHOSPHATE 10 MG/ML IJ SOLN
INTRAMUSCULAR | Status: DC | PRN
  Administered 2017-11-28: 10 mg via INTRAVENOUS

## 2017-11-28 MED ORDER — HEMOSTATIC AGENTS (NO CHARGE) OPTIME
TOPICAL | Status: DC | PRN
  Administered 2017-11-28: 1 via TOPICAL

## 2017-11-28 MED ORDER — FENTANYL CITRATE (PF) 100 MCG/2ML IJ SOLN
INTRAMUSCULAR | Status: AC
  Filled 2017-11-28: qty 2

## 2017-11-28 MED ORDER — SODIUM CHLORIDE 0.9% FLUSH: 3.0000 mL | INTRAVENOUS | Status: DC | PRN

## 2017-11-28 MED ORDER — HYDROMORPHONE HCL 1 MG/ML IJ SOLN
0.5000 mg | Freq: Once | INTRAMUSCULAR | Status: AC
  Administered 2017-11-28: 0.5 mg via INTRAVENOUS
  Filled 2017-11-28: qty 1

## 2017-11-28 MED ORDER — SODIUM CHLORIDE 0.9 % IV SOLN: 250.0000 mL | INTRAVENOUS | Status: DC

## 2017-11-28 MED ORDER — SUGAMMADEX SODIUM 200 MG/2ML IV SOLN
INTRAVENOUS | Status: DC | PRN
  Administered 2017-11-28: 200 mg via INTRAVENOUS

## 2017-11-28 MED ORDER — PHENYLEPHRINE 40 MCG/ML (10ML) SYRINGE FOR IV PUSH (FOR BLOOD PRESSURE SUPPORT)
PREFILLED_SYRINGE | INTRAVENOUS | Status: AC
  Filled 2017-11-28: qty 10

## 2017-11-28 MED ORDER — ONDANSETRON HCL 4 MG/2ML IJ SOLN
INTRAMUSCULAR | Status: AC
  Filled 2017-11-28: qty 2

## 2017-11-28 MED ORDER — ROCURONIUM BROMIDE 10 MG/ML (PF) SYRINGE
PREFILLED_SYRINGE | INTRAVENOUS | Status: AC
  Filled 2017-11-28: qty 10

## 2017-11-28 MED ORDER — ZOLPIDEM TARTRATE 5 MG PO TABS: 5.0000 mg | ORAL_TABLET | Freq: Every evening | ORAL | Status: DC | PRN

## 2017-11-28 MED ORDER — ONDANSETRON HCL 4 MG/2ML IJ SOLN: 4.0000 mg | Freq: Once | INTRAMUSCULAR | Status: DC | PRN

## 2017-11-28 MED ORDER — KETOROLAC TROMETHAMINE 30 MG/ML IJ SOLN
30.0000 mg | Freq: Four times a day (QID) | INTRAMUSCULAR | Status: DC
  Administered 2017-11-28 – 2017-11-29 (×2): 30 mg via INTRAVENOUS
  Filled 2017-11-28 (×2): qty 1

## 2017-11-28 MED ORDER — FENTANYL CITRATE (PF) 250 MCG/5ML IJ SOLN
INTRAMUSCULAR | Status: AC
  Filled 2017-11-28: qty 5

## 2017-11-28 MED ORDER — DEXAMETHASONE SODIUM PHOSPHATE 10 MG/ML IJ SOLN
INTRAMUSCULAR | Status: AC
  Filled 2017-11-28: qty 1

## 2017-11-28 MED ORDER — HYDROXYZINE HCL 25 MG PO TABS: 50.0000 mg | ORAL_TABLET | ORAL | Status: DC | PRN

## 2017-11-28 MED ORDER — PHENOL 1.4 % MT LIQD: 1.0000 | OROMUCOSAL | Status: DC | PRN

## 2017-11-28 MED ORDER — THROMBIN (RECOMBINANT) 20000 UNITS EX SOLR
CUTANEOUS | Status: AC
  Filled 2017-11-28: qty 20000

## 2017-11-28 MED ORDER — MENTHOL 3 MG MT LOZG: 1.0000 | LOZENGE | OROMUCOSAL | Status: DC | PRN

## 2017-11-28 MED ORDER — CEFAZOLIN SODIUM-DEXTROSE 2-4 GM/100ML-% IV SOLN
INTRAVENOUS | Status: AC
  Filled 2017-11-28: qty 100

## 2017-11-28 MED ORDER — SCOPOLAMINE 1 MG/3DAYS TD PT72
MEDICATED_PATCH | TRANSDERMAL | Status: DC | PRN
  Administered 2017-11-28: 1 via TRANSDERMAL

## 2017-11-28 MED ORDER — MEPERIDINE HCL 50 MG/ML IJ SOLN: 6.2500 mg | INTRAMUSCULAR | Status: DC | PRN

## 2017-11-28 MED ORDER — BUPIVACAINE HCL (PF) 0.5 % IJ SOLN
INTRAMUSCULAR | Status: AC
  Filled 2017-11-28: qty 30

## 2017-11-28 MED ORDER — LACTATED RINGERS IV SOLN
INTRAVENOUS | Status: DC | PRN
  Administered 2017-11-28 (×2): via INTRAVENOUS

## 2017-11-28 MED ORDER — PROPOFOL 10 MG/ML IV BOLUS
INTRAVENOUS | Status: DC | PRN
  Administered 2017-11-28: 120 mg via INTRAVENOUS

## 2017-11-28 MED ORDER — ACETAMINOPHEN 10 MG/ML IV SOLN
INTRAVENOUS | Status: DC | PRN
  Administered 2017-11-28: 1000 mg via INTRAVENOUS

## 2017-11-28 MED ORDER — ACETAMINOPHEN 650 MG RE SUPP: 650.0000 mg | RECTAL | Status: DC | PRN

## 2017-11-28 MED ORDER — FENTANYL CITRATE (PF) 100 MCG/2ML IJ SOLN
INTRAMUSCULAR | Status: DC | PRN
  Administered 2017-11-28: 100 ug via INTRAVENOUS

## 2017-11-28 MED ORDER — SODIUM CHLORIDE 0.9% FLUSH: 3.0000 mL | Freq: Two times a day (BID) | INTRAVENOUS | Status: DC

## 2017-11-28 MED ORDER — ESCITALOPRAM OXALATE 10 MG PO TABS
10.0000 mg | ORAL_TABLET | Freq: Every day | ORAL | Status: DC
  Filled 2017-11-28: qty 1

## 2017-11-28 MED ORDER — CEFAZOLIN SODIUM-DEXTROSE 2-3 GM-%(50ML) IV SOLR
INTRAVENOUS | Status: DC | PRN
  Administered 2017-11-28: 2 g via INTRAVENOUS

## 2017-11-28 MED ORDER — PROMETHAZINE HCL 25 MG/ML IJ SOLN
12.5000 mg | Freq: Four times a day (QID) | INTRAMUSCULAR | Status: DC | PRN
  Administered 2017-11-28: 6.25 mg via INTRAVENOUS
  Filled 2017-11-28 (×3): qty 1

## 2017-11-28 MED ORDER — MIDAZOLAM HCL 2 MG/2ML IJ SOLN
INTRAMUSCULAR | Status: AC
  Filled 2017-11-28: qty 2

## 2017-11-28 MED ORDER — KETOROLAC TROMETHAMINE 30 MG/ML IJ SOLN
30.0000 mg | Freq: Once | INTRAMUSCULAR | Status: AC
  Administered 2017-11-28: 30 mg via INTRAVENOUS

## 2017-11-28 MED ORDER — ARTIFICIAL TEARS OPHTHALMIC OINT
TOPICAL_OINTMENT | OPHTHALMIC | Status: AC
  Filled 2017-11-28: qty 3.5

## 2017-11-28 MED ORDER — SUCCINYLCHOLINE CHLORIDE 200 MG/10ML IV SOSY
PREFILLED_SYRINGE | INTRAVENOUS | Status: DC | PRN
  Administered 2017-11-28: 120 mg via INTRAVENOUS

## 2017-11-28 SURGICAL SUPPLY — 57 items
BAG DECANTER FOR FLEXI CONT (MISCELLANEOUS) ×2 IMPLANT
BENZOIN TINCTURE PRP APPL 2/3 (GAUZE/BANDAGES/DRESSINGS) IMPLANT
BLADE CLIPPER SURG (BLADE) IMPLANT
BUR ACRON 5.0MM COATED (BURR) IMPLANT
BUR MATCHSTICK NEURO 3.0 LAGG (BURR) ×2 IMPLANT
CANISTER SUCT 3000ML PPV (MISCELLANEOUS) ×2 IMPLANT
CARTRIDGE OIL MAESTRO DRILL (MISCELLANEOUS) ×1 IMPLANT
DECANTER SPIKE VIAL GLASS SM (MISCELLANEOUS) ×2 IMPLANT
DERMABOND ADVANCED (GAUZE/BANDAGES/DRESSINGS) ×1
DERMABOND ADVANCED .7 DNX12 (GAUZE/BANDAGES/DRESSINGS) ×1 IMPLANT
DIFFUSER DRILL AIR PNEUMATIC (MISCELLANEOUS) ×2 IMPLANT
DRAPE LAPAROTOMY 100X72X124 (DRAPES) ×2 IMPLANT
DRAPE MICROSCOPE LEICA (MISCELLANEOUS) ×2 IMPLANT
DRAPE POUCH INSTRU U-SHP 10X18 (DRAPES) ×2 IMPLANT
ELECT REM PT RETURN 9FT ADLT (ELECTROSURGICAL) ×2
ELECTRODE REM PT RTRN 9FT ADLT (ELECTROSURGICAL) ×1 IMPLANT
FLOSEAL 5ML (HEMOSTASIS) ×2 IMPLANT
GAUZE SPONGE 4X4 12PLY STRL (GAUZE/BANDAGES/DRESSINGS) ×2 IMPLANT
GAUZE SPONGE 4X4 16PLY XRAY LF (GAUZE/BANDAGES/DRESSINGS) IMPLANT
GLOVE BIO SURGEON STRL SZ 6.5 (GLOVE) ×2 IMPLANT
GLOVE BIOGEL PI IND STRL 7.0 (GLOVE) ×1 IMPLANT
GLOVE BIOGEL PI IND STRL 8 (GLOVE) ×2 IMPLANT
GLOVE BIOGEL PI INDICATOR 7.0 (GLOVE) ×1
GLOVE BIOGEL PI INDICATOR 8 (GLOVE) ×2
GLOVE ECLIPSE 7.5 STRL STRAW (GLOVE) ×4 IMPLANT
GLOVE EXAM NITRILE LRG STRL (GLOVE) IMPLANT
GLOVE EXAM NITRILE XL STR (GLOVE) IMPLANT
GLOVE EXAM NITRILE XS STR PU (GLOVE) IMPLANT
GOWN STRL REUS W/ TWL LRG LVL3 (GOWN DISPOSABLE) ×3 IMPLANT
GOWN STRL REUS W/ TWL XL LVL3 (GOWN DISPOSABLE) ×1 IMPLANT
GOWN STRL REUS W/TWL 2XL LVL3 (GOWN DISPOSABLE) ×2 IMPLANT
GOWN STRL REUS W/TWL LRG LVL3 (GOWN DISPOSABLE) ×3
GOWN STRL REUS W/TWL XL LVL3 (GOWN DISPOSABLE) ×1
KIT BASIN OR (CUSTOM PROCEDURE TRAY) ×2 IMPLANT
KIT TURNOVER KIT B (KITS) ×2 IMPLANT
NEEDLE HYPO 18GX1.5 BLUNT FILL (NEEDLE) IMPLANT
NEEDLE SPNL 18GX3.5 QUINCKE PK (NEEDLE) ×2 IMPLANT
NEEDLE SPNL 22GX3.5 QUINCKE BK (NEEDLE) ×2 IMPLANT
NS IRRIG 1000ML POUR BTL (IV SOLUTION) ×2 IMPLANT
OIL CARTRIDGE MAESTRO DRILL (MISCELLANEOUS) ×2
PACK LAMINECTOMY NEURO (CUSTOM PROCEDURE TRAY) ×2 IMPLANT
PAD ARMBOARD 7.5X6 YLW CONV (MISCELLANEOUS) ×6 IMPLANT
PATTIES SURGICAL .5 X1 (DISPOSABLE) ×2 IMPLANT
RUBBERBAND STERILE (MISCELLANEOUS) ×4 IMPLANT
SPONGE LAP 4X18 RFD (DISPOSABLE) IMPLANT
SPONGE SURGIFOAM ABS GEL 100 (HEMOSTASIS) ×2 IMPLANT
STRIP CLOSURE SKIN 1/2X4 (GAUZE/BANDAGES/DRESSINGS) IMPLANT
SUT PROLENE 6 0 BV (SUTURE) IMPLANT
SUT VIC AB 1 CT1 18XBRD ANBCTR (SUTURE) ×1 IMPLANT
SUT VIC AB 1 CT1 8-18 (SUTURE) ×1
SUT VIC AB 2-0 CP2 18 (SUTURE) ×2 IMPLANT
SUT VIC AB 3-0 SH 8-18 (SUTURE) ×2 IMPLANT
SYR 5ML LL (SYRINGE) IMPLANT
TAPE CLOTH SURG 4X10 WHT LF (GAUZE/BANDAGES/DRESSINGS) ×2 IMPLANT
TOWEL GREEN STERILE (TOWEL DISPOSABLE) ×2 IMPLANT
TOWEL GREEN STERILE FF (TOWEL DISPOSABLE) ×2 IMPLANT
WATER STERILE IRR 1000ML POUR (IV SOLUTION) ×2 IMPLANT

## 2017-11-28 NOTE — H&P (Signed)
Subjective: Patient is a 57 y.o. right-handed white female who is admitted for treatment of acute and disabling left L2-3 extraforaminal lumbar disc herniation.  Patient's symptoms began a little over a week ago.  [place]  She had increased back pain, but then gradually developed left lumbar radicular pain that became increasingly severe and disabling.  She describes pain from the left side of low back extending to the left buttock, anterior left thigh, anterior left knee, and anterior left leg.  She is had some numbness and tingling in the toes the left foot.  She saw Dr. Thurston HoleWainer 3 days ago and was given an IM injection of cortisone and started on a prednisone Dosepak as well as muscle relaxants.  Pain became more severe, and she return for reevaluation with him today, MRI was obtained and revealed an acute left L2-3 extraforaminal disc herniation, and he sent the [facility] for neurosurgical evaluation.  Her history is notable for previous L5-S1 lumbar decompression and arthrodesis for bilateral L5 pars interarticularis defects with spondylolisthesis in 2008.  Work-up in 2012 revealed a good fusion at that level.   Patient Active Problem List   Diagnosis Date Noted  . Panic attacks   . Venous disease   . Ejection fraction   . Palpitations   . Insomnia   . Mitral click   . Family history of aneurysm    Past Medical History:  Diagnosis Date  . Ejection fraction    60-65%, echo, August 01, 2010  . Family history of aneurysm    The patient's mother had an ascending aortic aneurysm.  There was no specific pathology.  The aortic valve was effected .  Marland Kitchen. Insomnia   . Mitral click    Late systolic click on physical exam, April, 2012,.  There is no murmur.  There is no mitral valve prolapse by echo  . Palpitations    April, 2012  . Panic attacks    2012  . Venous disease    Very slight superficial venous changes at the  ankle.. No significant abnormality    Past Surgical History:  Procedure Laterality Date  . CESAREAN SECTION  1985  . hysterectomy unknown type  1995  . TONSILLECTOMY  1963     (Not in a hospital admission) No Known Allergies  Social History   Tobacco Use  . Smoking status: Never Smoker  Substance Use Topics  . Alcohol use: Yes    Comment: socially    Family History  Problem Relation Age of Onset  . Aneurysm Unknown   . Hypertension Unknown      Review of Systems Pertinent items noted in HPI and remainder of comprehensive ROS otherwise negative.  Objective: Vital signs in last 24 hours: Temp:  [98.9 F (37.2 C)] 98.9 F (37.2 C) (08/01 1421) Pulse Rate:  [80] 80 (08/01 1421) Resp:  [20] 20 (08/01 1421) BP: (177)/(100) 177/100 (08/01 1421) SpO2:  [99 %] 99 % (08/01 1421)  EXAM: Thin white female in disabling pain.   Lungs are clear to auscultation , the patient has symmetrical respiratory excursion. Heart has a regular rate and rhythm normal S1 and S2 no murmur.   Abdomen is soft nontender nondistended bowel sounds are present. Extremity examination shows no clubbing cyanosis or edema. Motor examination shows 5 over 5 strength in the lower extremities including the iliopsoas quadriceps dorsiflexor extensor hallicus  longus and plantar flexor bilaterally.  Sensation is intact to pinprick in the distal lower extremities. Reflexes are 2+ at the quadriceps and gastrocnemius, and are symmetrical bilaterally. No pathologic reflexes are present. Patient has a normal gait and stance.  Data Review:CBC    Component Value Date/Time   WBC 5.7 08/16/2010 1507   RBC 4.13 08/16/2010 1507   HGB 12.8 08/16/2010 1507   HCT 37.0 08/16/2010 1507   PLT 173.0 08/16/2010 1507   MCV 89.5 08/16/2010 1507   MCHC 34.6 08/16/2010 1507   RDW 13.0 08/16/2010 1507   LYMPHSABS 1.6 08/16/2010 1507   MONOABS 0.3 08/16/2010 1507   EOSABS 0.1 08/16/2010 1507   BASOSABS 0.0 08/16/2010 1507                           BMET    Component Value Date/Time   NA 143 08/16/2010 1507   K 3.3 (L) 08/16/2010 1507   CL 102 08/16/2010 1507   CO2 33 (H) 08/16/2010 1507   GLUCOSE 76 08/16/2010 1507   BUN 16 08/16/2010 1507   CREATININE 0.8 08/16/2010 1507   CALCIUM 9.5 08/16/2010 1507     Assessment/Plan: Patient with disabling left lumbar radiculopathy secondary to a left L2 3 extraforaminal lumbar disc herniation, who is admitted for further treatment and care.  We discussed options of further nonsurgical measures including continuing the steroid Dosepak, considering epidural steroid injections and so on is considering surgical intervention specifically a left L2 3 extraforaminal microdiscectomy.  We discussed the nature of surgery, different surgery, hospital stay, and recuperation.  We discussed limitations to the postoperative period.  We discussed restarting the risk of infection, bleeding, possibly transfusion, the risks of nerve dysfunction with pain, weakness, numbness, or tingling, the risks of spinal fluid leakage from the spinal sac and possibly for the surgery risk of recurrent discussion and possibly for the surgery, and anesthetic risks of microinfarction, stroke, pneumonia, and death.  After discussing all this patient does want to proceed with surgery as soon as feasible and we will admit the patient for such.  Her questions and her husband's questions were answered for them.   Hewitt Shorts, MD 11/28/2017 3:17 PM

## 2017-11-28 NOTE — ED Notes (Signed)
Pt to OR at this time.

## 2017-11-28 NOTE — Progress Notes (Signed)
Vitals:   11/28/17 1830 11/28/17 1835 11/28/17 1850 11/28/17 1857  BP:  134/85 140/88   Pulse: 93 99 93   Resp: (!) 24 15 13    Temp:      TempSrc:      SpO2: 98% 98% 97% 97%  Weight:      Height:        CBC Recent Labs    11/28/17 1428  WBC 16.0*  HGB 14.8  HCT 43.7  PLT 237   BMET Recent Labs    11/28/17 1428  NA 140  K 2.8*  CL 104  CO2 21*  GLUCOSE 150*  BUN 15  CREATININE 0.85  CALCIUM 9.2    Patient just arrived to floor from PACU.  Ambulated from chair to bed.  Dressing clean and dry.  No void yet.  Excellent relief of disabling radicular pain.  Plan: Doing well so far following surgery.  Will progress to ambulation in the halls this evening.  Continue to progress through postoperative recovery.  Hewitt ShortsNUDELMAN,ROBERT W, MD 11/28/2017, 7:05 PM

## 2017-11-28 NOTE — Op Note (Signed)
11/28/2017  5:34 PM  PATIENT:  Alexis Lin  57 y.o. female  PRE-OPERATIVE DIAGNOSIS: Left L2-3 extraforaminal lumbar disc herniation, lumbar degenerative disease, lumbar spondylosis, left lumbar radiculopathy  POST-OPERATIVE DIAGNOSIS:  Left L2-3 extraforaminal lumbar disc herniation, lumbar degenerative disease, lumbar spondylosis, left lumbar radiculopathy  PROCEDURE:  Procedure(s):  Left Lumbar Two-Three extraforaminal microdiscectomy with microdissection, microsurgical technique, and the operating microscope  SURGEON: Shirlean Kellyobert Deisy Ozbun, MD  ASSISTANTS: Coletta MemosKyle Cabbell, MD  ANESTHESIA:   general  EBL:  Total I/O In: 1000 [I.V.:1000] Out: - EBL: 100 cc  BLOOD ADMINISTERED:none  COUNT:  Correct per nursing staff  DICTATION: Patient was brought to the operating room and placed under general endotracheal anesthesia. Patient was turned to prone position the lumbar region was prepped with Betadine soap and solution and draped in a sterile fashion. The midline was infiltrated with local anesthetic with epinephrine. A localizing x-ray was taken and the L2-3 level was identified. Midline incision was made over the L2-3 level and was carried down through the subcutaneous tissue to the lumbar fascia. The lumbar fascia was incised on the left side and the paraspinal muscles were dissected from the spinous processes and lamina in a subperiosteal fashion. Another x-ray was taken and the left L2-3 intralaminar space and intertransverse were identified. The operating microscope was draped and brought into the field provided additional magnification, illumination, and visualization. Dissection was begun in the intertransverse space. The left L2 and L3 transverse processes were identified, and the intertransverse ligament was carefully removed. The exiting left L2 nerve root was identified. The disc herniation was identified ventral to the nerve root.  Free fragment disc herniation was removed in a piecemeal  fashion, decompressing the exiting left L2 nerve root.  The extra foraminal space was thoroughly explored and all loose fragments of disc herniation were removed.  Once the discectomy was completed and good decompression of the nerve had been achieved hemostasis was established with the use of bipolar cautery and Gelfoam with thrombin. The Gelfoam was removed and hemostasis confirmed.  A thin layer of FloSeal was applied.  We then instilled 2 cc of fentanyl and 80 mg of Depo-Medrol into the extraforaminal space. Deep fascia was closed with interrupted undyed 1 Vicryl sutures. Scarpa's fascia was closed with interrupted undyed 2-0 Vicryl sutures in the subcutaneous and subcuticular layer were closed with interrupted inverted 3-0 undyed Vicryl sutures. The skin edges were approximated with Dermabond. Following surgery the patient was turned back to a supine position to be reversed from the anesthetic extubated and transferred to the recovery room for further care.  PLAN OF CARE: Admit for overnight observation  PATIENT DISPOSITION:  PACU - hemodynamically stable.   Delay start of Pharmacological VTE agent (>24hrs) due to surgical blood loss or risk of bleeding:  yes

## 2017-11-28 NOTE — ED Notes (Signed)
Dr. Newell CoralNudelman in to assess pt and informed about surgery.  OR consent signed by pt,

## 2017-11-28 NOTE — ED Triage Notes (Signed)
Pt seen by Ortho today and MRI done and pt has ruptured disk; pt is pt of Dr. Frederic JerichoNodleman; expected to go to OR today

## 2017-11-28 NOTE — ED Provider Notes (Signed)
[facility] EMERGENCY DEPARTMENT Provider Note   CSN: [id number] Arrival date & time: 11/28/17  1409     History   Chief Complaint Chief Complaint  Patient presents with  . Back Pain    HPI Alexis Lin is a 57 y.o. female with a past medical history of palpitations, who presents to ED for evaluation of ruptured disc in lower back.  She was seen by Dr. Thurston Hole of orthopedics on Monday for what she thought was sciatica.  She was given a prednisone Dosepak which she has been taking.  States that the pain got a lot worse today.  She went back to Dr. Thurston Hole who completed an MRI that showed a ruptured disc.  He got in touch with Dr. Newell Coral who stated that patient should come to the ED for evaluation by him and possible surgery today.  Patient states she has been having severe lower back pain not improved with Aleve, muscle relaxer.  The pain radiates down her left leg and she endorses paresthesias here as well.  Denies any loss of bowel or bladder function.  She had an L4-L5 fusion done by Dr. Newell Coral in 2008.  Denies any injuries or falls, chest pain, fever.  HPI  Past Medical History:  Diagnosis Date  . Ejection fraction    60-65%, echo, August 01, 2010  . Family history of aneurysm    The patient's mother had an ascending aortic aneurysm.  There was no specific pathology.  The aortic valve was effected .  Marland Kitchen Insomnia   . Mitral click    Late systolic click on physical exam, April, 2012,.  There is no murmur.  There is no mitral valve prolapse by echo  . Palpitations    April, 2012  . Panic attacks    2012  . Venous disease    Very slight superficial venous changes at the ankle.. No significant abnormality    Patient Active Problem List   Diagnosis Date Noted  . Panic attacks   . Venous disease   . Ejection fraction   . Palpitations   . Insomnia   . Mitral click   . Family history of aneurysm     Past Surgical History:  Procedure Laterality Date  .  CESAREAN SECTION  1985  . hysterectomy unknown type  1995  . TONSILLECTOMY  1963     OB History   None      Home Medications    Prior to Admission medications   Medication Sig Start Date End Date Taking? Authorizing Provider  escitalopram (LEXAPRO) 10 MG tablet Take 1 tablet by mouth Daily. 09/07/11   [provider]  LORazepam (ATIVAN) 0.5 MG tablet Take 0.5 mg by mouth every 8 (eight) hours.      [provider]  meloxicam (MOBIC) 15 MG tablet Take 15 mg by mouth daily.  08/21/11   [provider]  Multiple Vitamin (MULTIVITAMIN) capsule occasional     [provider]  Polyethylene Glycol 3350 (MIRALAX PO) 1/2 of dose daily     [provider]  zolpidem (AMBIEN CR) 6.25 MG CR tablet Take 6.25 mg by mouth at bedtime as needed.  09/16/11   [provider]    Family History Family History  Problem Relation Age of Onset  . Aneurysm Unknown   . Hypertension Unknown     Social History Social History   Tobacco Use  . Smoking status: Never Smoker  Substance Use Topics  . Alcohol  use: Yes    Comment: socially  . Drug use: No     Allergies   Patient has no known allergies.   Review of Systems Review of Systems  Constitutional: Negative for appetite change, chills and fever.  HENT: Negative for ear pain, rhinorrhea, sneezing and sore throat.   Eyes: Negative for photophobia and visual disturbance.  Respiratory: Negative for cough, chest tightness, shortness of breath and wheezing.   Cardiovascular: Negative for chest pain and palpitations.  Gastrointestinal: Negative for abdominal pain, blood in stool, constipation, diarrhea, nausea and vomiting.  Genitourinary: Negative for dysuria, hematuria and urgency.  Musculoskeletal: Positive for back pain. Negative for myalgias.  Skin: Negative for rash.  Neurological: Negative for dizziness, weakness and light-headedness.     Physical Exam Updated Vital Signs BP (!)  177/100 (BP Location: Right Arm)   Pulse 80   Temp 98.9 F (37.2 C) (Axillary) Comment (Src): Pt vomitted while trying to get an oral temperature.   Resp 20   SpO2 99%   Physical Exam  Constitutional: She appears well-developed and well-nourished. No distress.  HENT:  Head: Normocephalic and atraumatic.  Nose: Nose normal.  Eyes: Conjunctivae and EOM are normal. Left eye exhibits no discharge. No scleral icterus.  Neck: Normal range of motion. Neck supple.  Cardiovascular: Normal rate, regular rhythm, normal heart sounds and intact distal pulses. Exam reveals no gallop and no friction rub.  No murmur heard. Pulmonary/Chest: Effort normal and breath sounds normal. No respiratory distress.  Abdominal: Soft. Bowel sounds are normal. She exhibits no distension. There is no tenderness. There is no guarding.  Musculoskeletal: Normal range of motion. She exhibits no edema or tenderness.  Patient unable to sit forward for back palpation 2/2 pain. Lower extremities with normal sensation to light touch. No saddle anesthesia noted.  Neurological: She is alert. She exhibits normal muscle tone. Coordination normal.  Skin: Skin is warm and dry. No rash noted.  Psychiatric: She has a normal mood and affect.  Nursing note and vitals reviewed.    ED Treatments / Results  Labs (all labs ordered are listed, but only abnormal results are displayed) Labs Reviewed - No data to display  EKG None  Radiology No results found.  Procedures Procedures (including critical care time)  Medications Ordered in ED Medications  HYDROmorphone (DILAUDID) injection 0.5 mg (0.5 mg Intravenous Given 11/28/17 1443)     Initial Impression / Assessment and Plan / ED Course  I have reviewed the triage vital signs and the nursing notes.  Pertinent labs & imaging results that were available during my care of the patient were reviewed by me and considered in my medical decision making (see chart for details).       57 year old female presents to ED for evaluation of ruptured disc in lower back.  She was seen by Dr. Thurston Hole of orthopedics where MRI of the lumbar spine was done today and she was found to have a ruptured disc in her lower back.  She was sent by him to the ED for Dr. Newell Coral to examine and take to the OR.  Patient reports severe pain in the area.  Reports some pain shooting down her left leg as well as paresthesias.  Will obtain CBC, BMP for screening. Dr. Newell Coral to see patient.  Portions of this note were generated with Scientist, clinical (histocompatibility and immunogenetics). Dictation errors may occur despite best attempts at proofreading.   Final Clinical Impressions(s) / ED Diagnoses   Final diagnoses:  None  ED Discharge Orders    None       Dietrich PatesKhatri, Tristin Vandeusen, PA-C 11/28/17 1512    Melene PlanFloyd, Dan, DO 11/28/17 1536    Melene PlanFloyd, Dan, DO 11/28/17 1537

## 2017-11-28 NOTE — Anesthesia Procedure Notes (Signed)
Procedure Name: Intubation Date/Time: 11/28/2017 4:47 PM Performed by: Izola Priceockfield, Herb Beltre Walton Jr., CRNA Pre-anesthesia Checklist: Patient identified, Emergency Drugs available, Suction available and Patient being monitored Patient Re-evaluated:Patient Re-evaluated prior to induction Oxygen Delivery Method: Circle system utilized Preoxygenation: Pre-oxygenation with 100% oxygen Induction Type: IV induction Ventilation: Mask ventilation without difficulty Laryngoscope Size: Miller and 2 Grade View: Grade I Tube type: Oral Tube size: 7.0 mm Number of attempts: 1 Airway Equipment and Method: Stylet and Oral airway Placement Confirmation: ETT inserted through vocal cords under direct vision,  positive ETCO2 and breath sounds checked- equal and bilateral Secured at: 21 cm Tube secured with: Tape Dental Injury: Teeth and Oropharynx as per pre-operative assessment

## 2017-11-28 NOTE — Transfer of Care (Signed)
Immediate Anesthesia Transfer of Care Note  Patient: Alexis Lin  Procedure(s) Performed: Left Lumbar Two-Three extraforaminal microdiscectomy (Left )  Patient Location: PACU  Anesthesia Type:General  Level of Consciousness: awake, alert  and oriented  Airway & Oxygen Therapy: Patient Spontanous Breathing and Patient connected to face mask oxygen  Post-op Assessment: Report given to RN and Post -op Vital signs reviewed and stable  Post vital signs: Reviewed and stable  Last Vitals:  Vitals Value Taken Time  BP 133/92 11/28/2017  5:50 PM  Temp    Pulse 118 11/28/2017  5:53 PM  Resp 36 11/28/2017  5:53 PM  SpO2 100 % 11/28/2017  5:53 PM  Vitals shown include unvalidated device data.  Last Pain:  Vitals:   11/28/17 1442  TempSrc:   PainSc: 10-Worst pain ever         Complications: No apparent anesthesia complications

## 2017-11-28 NOTE — Anesthesia Preprocedure Evaluation (Signed)
Anesthesia Evaluation  Patient identified by MRN, date of birth, ID band Patient awake    Reviewed: Allergy & Precautions, NPO status , Patient's Chart, lab work & pertinent test results  Airway Mallampati: I  TM Distance: >3 FB Neck ROM: Full    Dental   Pulmonary    Pulmonary exam normal        Cardiovascular Normal cardiovascular exam     Neuro/Psych Anxiety    GI/Hepatic   Endo/Other    Renal/GU      Musculoskeletal   Abdominal   Peds  Hematology   Anesthesia Other Findings   Reproductive/Obstetrics                             Anesthesia Physical Anesthesia Plan  ASA: II  Anesthesia Plan: General   Post-op Pain Management:    Induction: Intravenous  PONV Risk Score and Plan: 3 and Ondansetron, Dexamethasone, Midazolam and Scopolamine patch - Pre-op  Airway Management Planned: Oral ETT  Additional Equipment:   Intra-op Plan:   Post-operative Plan: Extubation in OR  Informed Consent: I have reviewed the patients History and Physical, chart, labs and discussed the procedure including the risks, benefits and alternatives for the proposed anesthesia with the patient or authorized representative who has indicated his/her understanding and acceptance.     Plan Discussed with: CRNA and Surgeon  Anesthesia Plan Comments:         Anesthesia Quick Evaluation

## 2017-11-29 ENCOUNTER — Encounter (HOSPITAL_COMMUNITY): Payer: Self-pay | Admitting: Neurosurgery

## 2017-11-29 MED ORDER — HYDROCODONE-ACETAMINOPHEN 5-325 MG PO TABS: 1.0000 | ORAL_TABLET | ORAL | 0 refills | Status: DC | PRN

## 2017-11-29 MED FILL — Thrombin (Recombinant) For Soln 20000 Unit: CUTANEOUS | Qty: 1 | Status: AC

## 2017-11-29 NOTE — Progress Notes (Signed)
Patient alert and oriented, mae's well, voiding adequate amount of urine, swallowing without difficulty, no c/o pain at time of discharge. Patient discharged home with family. Script and discharged instructions given to patient. Patient and family stated understanding of instructions given. Patient has an appointment with Dr. Nudelman 

## 2017-11-29 NOTE — Discharge Instructions (Signed)
  Return to Work Will be discussed at you follow up appointment. Call Your Doctor If Any of These Occur Redness, drainage, or swelling at the wound.  Temperature greater than 101 degrees. Severe pain not relieved by pain medication. Incision starts to come apart. Follow Up Appt Call today for appointment in 3 weeks [phone]) or for problems.  If you have any hardware placed in your spine, you will need an x-ray before your appointment. 

## 2017-11-29 NOTE — Discharge Summary (Signed)
Physician Discharge Summary  Patient ID: Alexis Lin MRN: [id number] DOB/AGE: 59/06/1960 59 y.o.  Admit date: 11/28/2017 Discharge date: 11/29/2017  Admission Diagnoses:  Left L2-3 extraforaminal lumbar disc herniation, lumbar degenerative disease, lumbar spondylosis, left lumbar radiculopathy  Discharge Diagnoses:  Left L2-3 extraforaminal lumbar disc herniation, lumbar degenerative disease, lumbar spondylosis, left lumbar radiculopathy  Active Problems:   HNP (herniated nucleus pulposus), lumbar   Discharged Condition: good  Hospital Course: Patient presented to the emergency room with disabling left lumbar radiculopathy secondary to a left L2-3 extra foraminal lumbar disc herniation.  She was admitted and taken to surgery for a left L2 3X foraminal microdiscectomy.  She had excellent relief of her disabling radicular pain.  Her incision is healing nicely.  She is up and ambulating actively.  She is voiding well.  We are discharging her to home with instructions regarding wound care and activities.  We have asked that she contact the office to set up a postoperative visit appointment to be seen in 3 weeks.  Discharge Exam: Blood pressure 115/76, pulse 74, temperature 97.6 F (36.4 C), temperature source Oral, resp. rate 16, height 5' 1.5" (1.562 m), weight 44.9 kg (99 lb), SpO2 97 %.  Disposition: Discharge disposition: 01-Home or Self Care       Discharge Instructions    Discharge wound care:   Complete by:  As directed    Leave the wound open to air. Shower daily with the wound uncovered. Water and soapy water should run over the incision area. Do not wash directly on the incision for 2 weeks. Remove the glue after 2 weeks.   Driving Restrictions   Complete by:  As directed    No driving for 2 weeks. May ride in the car locally now. May begin to drive locally in 2 weeks.   Other Restrictions   Complete by:  As directed    Walk gradually increasing distances out in the fresh  air at least twice a day. Walking additional 6 times inside the house, gradually increasing distances, daily. No bending, lifting, or twisting. Perform activities between shoulder and waist height (that is at counter height when standing or table height when sitting).     Allergies as of 11/29/2017   No Known Allergies     Medication List    TAKE these medications   escitalopram 10 MG tablet Commonly known as:  LEXAPRO Take 10 mg by mouth Daily.   HYDROcodone-acetaminophen 5-325 MG tablet Commonly known as:  NORCO/VICODIN Take 1-2 tablets by mouth every 4 (four) hours as needed (pain).   ONE-A-DAY WOMENS VITACRAVES Chew Chew 1 each by mouth daily.   polyethylene glycol packet Commonly known as:  MIRALAX / GLYCOLAX Take 8.5 g by mouth daily.   zolpidem 6.25 MG CR tablet Commonly known as:  AMBIEN CR Take 6.25 mg by mouth at bedtime as needed.            Discharge Care Instructions  (From admission, onward)        Start     Ordered   11/29/17 0000  Discharge wound care:    Comments:  Leave the wound open to air. Shower daily with the wound uncovered. Water and soapy water should run over the incision area. Do not wash directly on the incision for 2 weeks. Remove the glue after 2 weeks.   11/29/17 0851       Signed: Hewitt ShortsNUDELMAN,Carvin Almas W 11/29/2017, 8:51 AM

## 2017-12-02 NOTE — Anesthesia Postprocedure Evaluation (Signed)
Anesthesia Post Note  Patient: Alexis Lin  Procedure(s) Performed: Left Lumbar Two-Three extraforaminal microdiscectomy (Left )     Patient location during evaluation: PACU Anesthesia Type: General Level of consciousness: awake and sedated Pain management: pain level controlled Vital Signs Assessment: post-procedure vital signs reviewed and stable Respiratory status: spontaneous breathing Cardiovascular status: stable Postop Assessment: no apparent nausea or vomiting Anesthetic complications: no    Last Vitals:  Vitals:   11/29/17 0405 11/29/17 0712  BP: 105/70 115/76  Pulse: 75 74  Resp: 16 16  Temp: 36.6 C 36.4 C  SpO2: 98% 97%    Last Pain:  Vitals:   11/29/17 0712  TempSrc: Oral  PainSc:    Pain Goal: Patients Stated Pain Goal: 3 (11/29/17 0505)               Kaida Games JR,JOHN Susann GivensFRANKLIN

## 2018-11-13 ENCOUNTER — Other Ambulatory Visit: Payer: Self-pay

## 2018-11-13 DIAGNOSIS — Z20822 Contact with and (suspected) exposure to covid-19: Secondary | ICD-10-CM

## 2018-11-18 LAB — NOVEL CORONAVIRUS, NAA: SARS-CoV-2, NAA: NOT DETECTED

## 2019-06-12 IMAGING — CR DG LUMBAR SPINE 2-3V
2 series · 2 of 2 positions shown · non-contrast
Comparison: MRI of the lumbosacral spine 11/28/2017

CLINICAL DATA: Left lumbar L2-L3 extraforaminal micro discectomy.

EXAM:
LUMBAR SPINE - 2-3 VIEW

[lateral (1 of 2)]
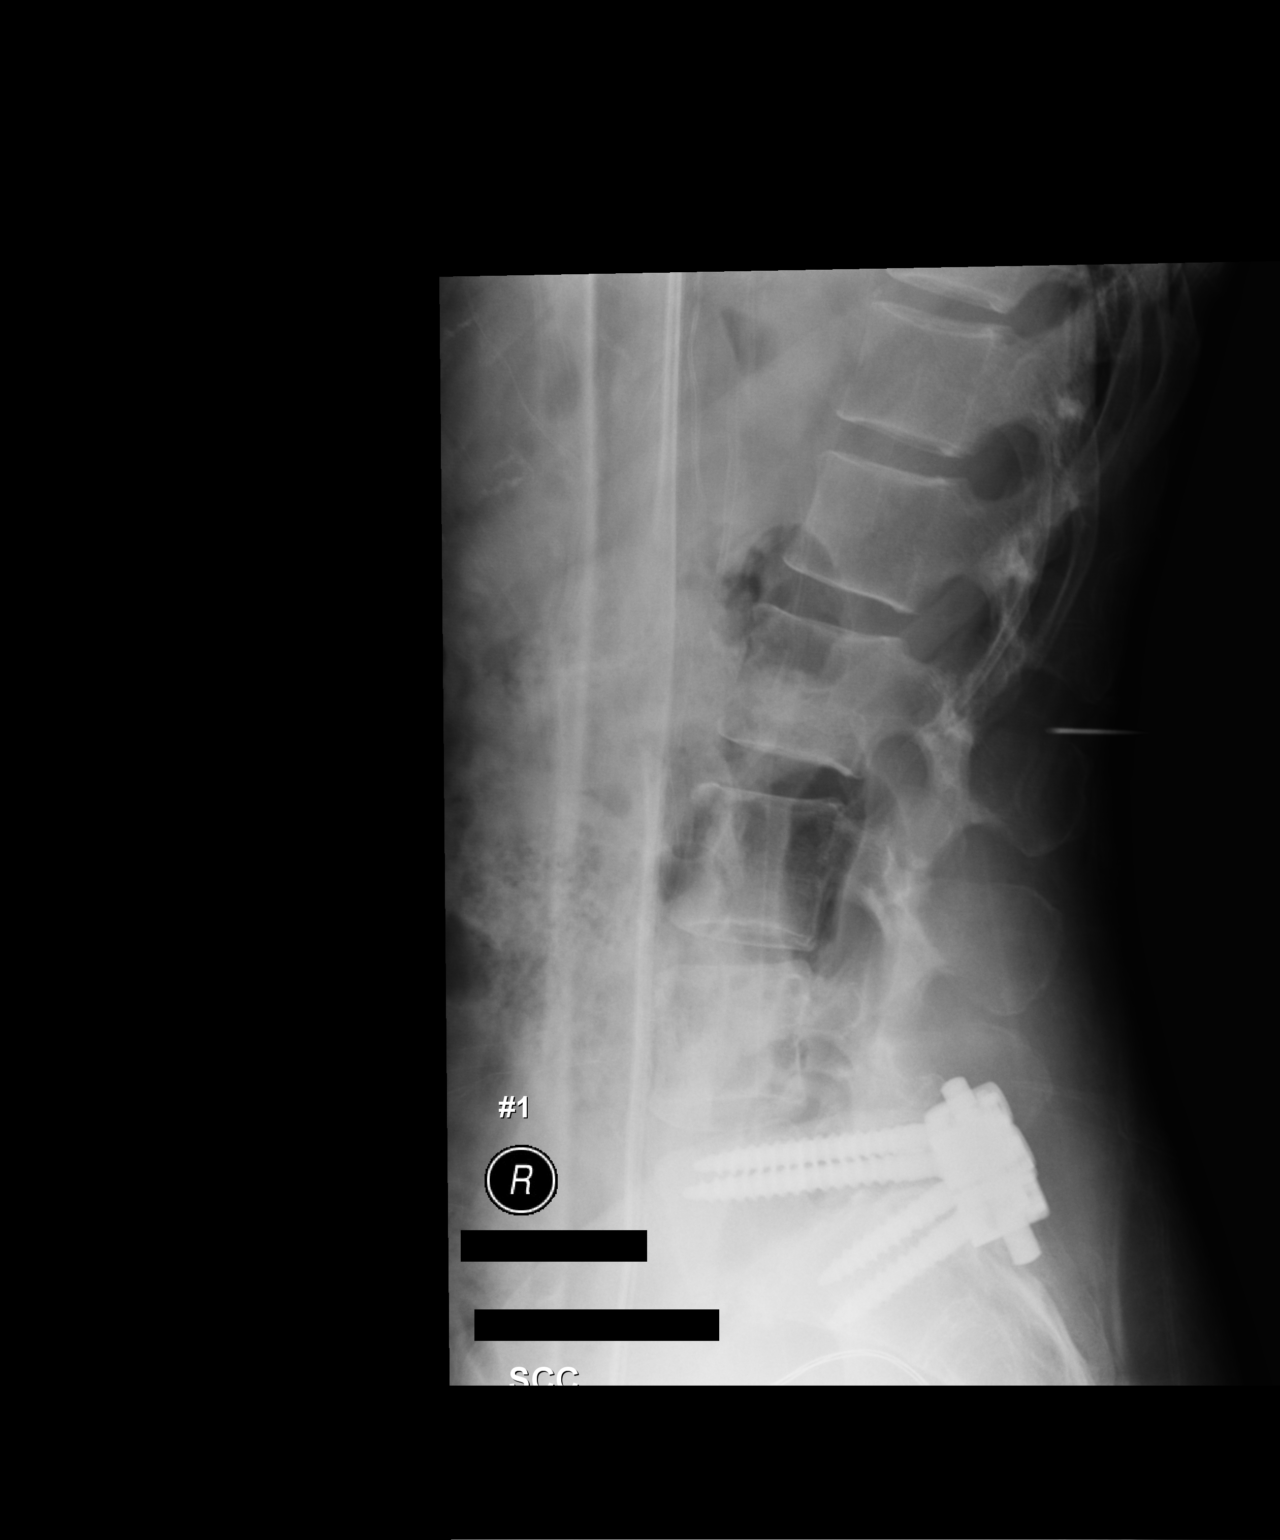

[lateral (2 of 2)]
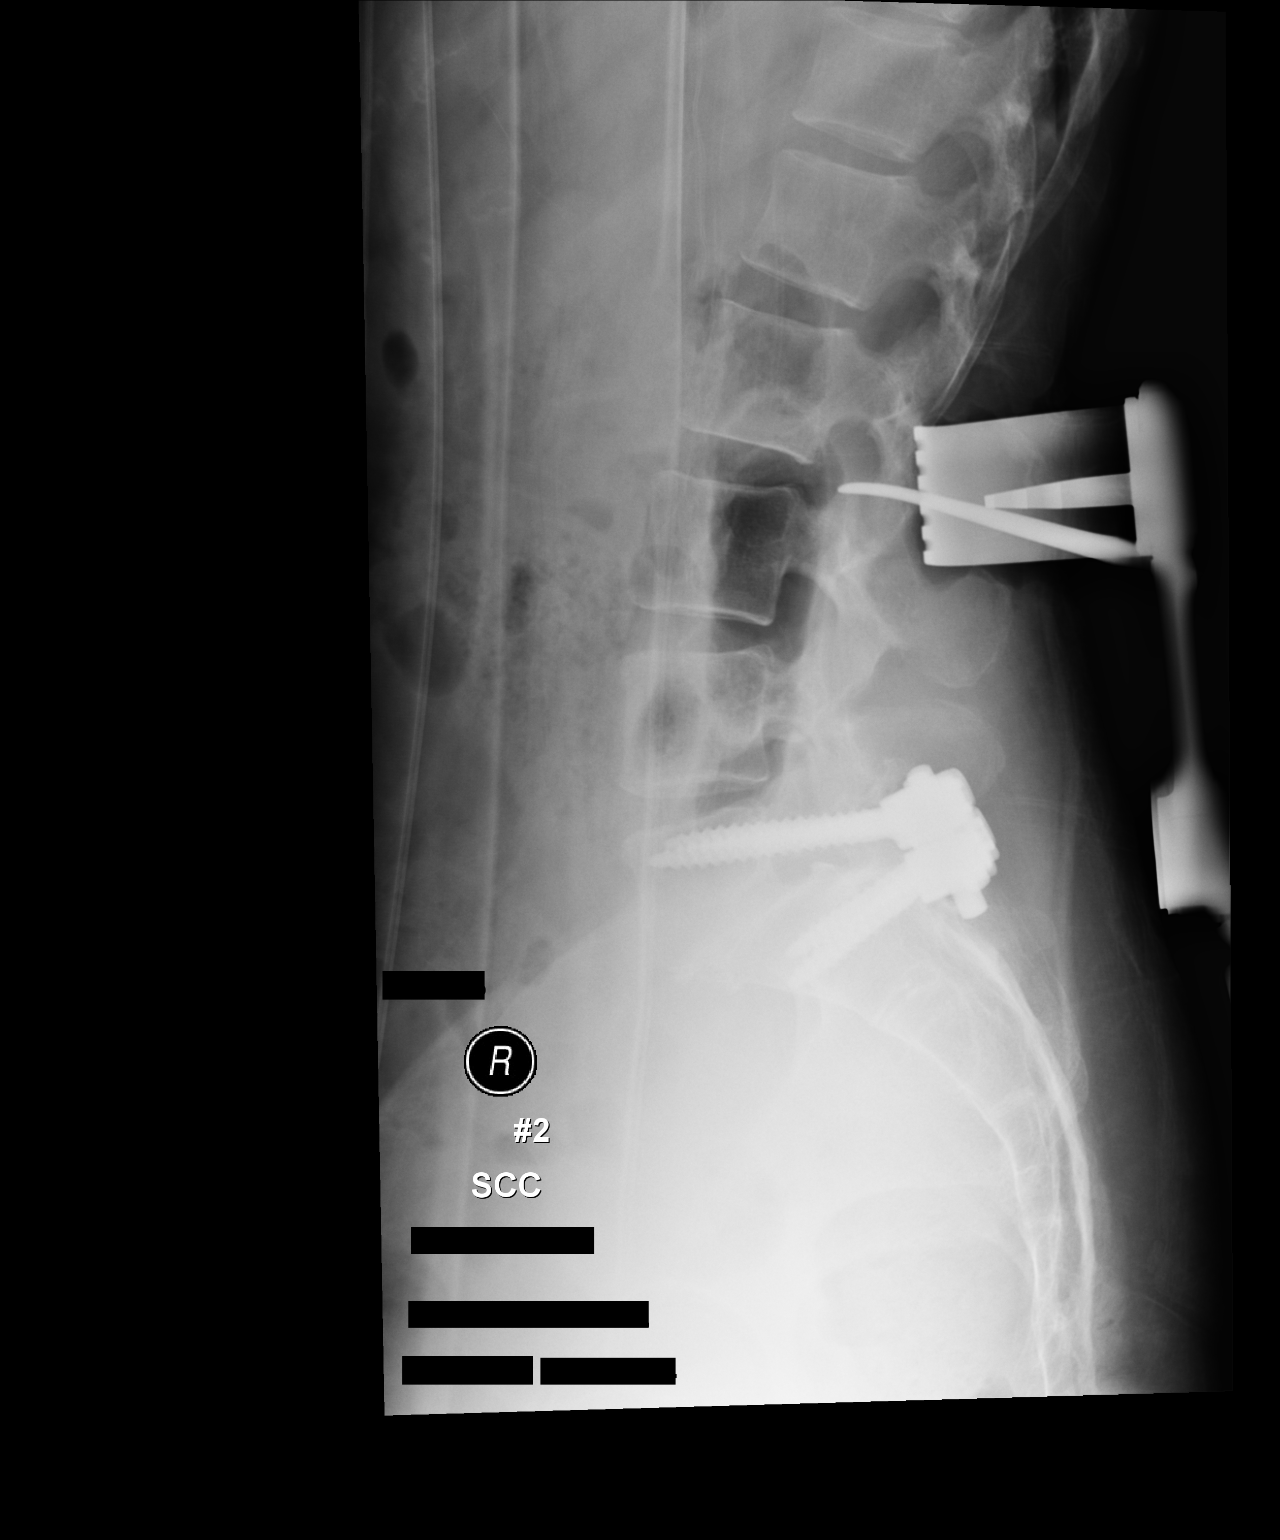

[2 of 2 positions shown; findings below may reference images not displayed]

FINDINGS: Two cross-table lateral radiographs of the lumbosacral spine
demonstrate surgical instruments at the level of L2-L3. Prior L5-S1
posterior fusion with stable anterolisthesis of L5 on S1.
IMPRESSION: Surgical instruments at the level of L2-L3.

## 2021-11-18 ENCOUNTER — Inpatient Hospital Stay (HOSPITAL_BASED_OUTPATIENT_CLINIC_OR_DEPARTMENT_OTHER)
Admission: EM | Admit: 2021-11-18 | Discharge: 2021-11-23 | DRG: 440 | Disposition: A | Discharge status: Home / Self Care | Payer: BC Managed Care – PPO | Attending: Internal Medicine | Admitting: Internal Medicine

## 2021-11-18 ENCOUNTER — Encounter (HOSPITAL_BASED_OUTPATIENT_CLINIC_OR_DEPARTMENT_OTHER): Payer: Self-pay | Admitting: Emergency Medicine

## 2021-11-18 ENCOUNTER — Emergency Department (HOSPITAL_BASED_OUTPATIENT_CLINIC_OR_DEPARTMENT_OTHER): Payer: BC Managed Care – PPO

## 2021-11-18 DIAGNOSIS — Z789 Other specified health status: Secondary | ICD-10-CM

## 2021-11-18 DIAGNOSIS — G4700 Insomnia, unspecified: Secondary | ICD-10-CM | POA: Diagnosis not present

## 2021-11-18 DIAGNOSIS — K859 Acute pancreatitis without necrosis or infection, unspecified: Secondary | ICD-10-CM | POA: Diagnosis present

## 2021-11-18 DIAGNOSIS — Z8719 Personal history of other diseases of the digestive system: Secondary | ICD-10-CM

## 2021-11-18 DIAGNOSIS — Z79899 Other long term (current) drug therapy: Secondary | ICD-10-CM

## 2021-11-18 DIAGNOSIS — R197 Diarrhea, unspecified: Secondary | ICD-10-CM | POA: Diagnosis not present

## 2021-11-18 DIAGNOSIS — K8020 Calculus of gallbladder without cholecystitis without obstruction: Secondary | ICD-10-CM | POA: Diagnosis not present

## 2021-11-18 DIAGNOSIS — Z9049 Acquired absence of other specified parts of digestive tract: Secondary | ICD-10-CM

## 2021-11-18 DIAGNOSIS — D72829 Elevated white blood cell count, unspecified: Secondary | ICD-10-CM

## 2021-11-18 DIAGNOSIS — E876 Hypokalemia: Secondary | ICD-10-CM | POA: Diagnosis not present

## 2021-11-18 DIAGNOSIS — E860 Dehydration: Secondary | ICD-10-CM | POA: Diagnosis present

## 2021-11-18 DIAGNOSIS — F1090 Alcohol use, unspecified, uncomplicated: Secondary | ICD-10-CM | POA: Diagnosis not present

## 2021-11-18 DIAGNOSIS — Z90710 Acquired absence of both cervix and uterus: Secondary | ICD-10-CM

## 2021-11-18 DIAGNOSIS — F410 Panic disorder [episodic paroxysmal anxiety] without agoraphobia: Secondary | ICD-10-CM | POA: Diagnosis not present

## 2021-11-18 LAB — CBC
HCT: 41.5 % (ref 36.0–46.0)
Hemoglobin: 14 g/dL (ref 12.0–15.0)
MCH: 30.6 pg (ref 26.0–34.0)
MCHC: 33.7 g/dL (ref 30.0–36.0)
MCV: 90.8 fL (ref 80.0–100.0)
Platelets: 173 10*3/uL (ref 150–400)
RBC: 4.57 MIL/uL (ref 3.87–5.11)
RDW: 12.6 % (ref 11.5–15.5)
WBC: 17 10*3/uL — ABNORMAL HIGH (ref 4.0–10.5)
nRBC: 0 % (ref 0.0–0.2)

## 2021-11-18 LAB — COMPREHENSIVE METABOLIC PANEL
ALT: 13 U/L (ref 0–44)
AST: 15 U/L (ref 15–41)
Albumin: 4.7 g/dL (ref 3.5–5.0)
Alkaline Phosphatase: 24 U/L — ABNORMAL LOW (ref 38–126)
Anion gap: 13 (ref 5–15)
BUN: 12 mg/dL (ref 8–23)
CO2: 26 mmol/L (ref 22–32)
Calcium: 9.6 mg/dL (ref 8.9–10.3)
Chloride: 99 mmol/L (ref 98–111)
Creatinine, Ser: 0.79 mg/dL (ref 0.44–1.00)
GFR, Estimated: >60 mL/min (ref >60)
Glucose, Bld: 141 mg/dL — ABNORMAL HIGH (ref 70–99)
Potassium: 3.4 mmol/L — ABNORMAL LOW (ref 3.5–5.1)
Sodium: 138 mmol/L (ref 135–145)
Total Bilirubin: 1 mg/dL (ref 0.3–1.2)
Total Protein: 7.1 g/dL (ref 6.5–8.1)

## 2021-11-18 LAB — URINALYSIS, ROUTINE W REFLEX MICROSCOPIC
Bilirubin Urine: NEGATIVE
Glucose, UA: NEGATIVE mg/dL
Hgb urine dipstick: NEGATIVE
Ketones, ur: 40 mg/dL — AB
Leukocytes,Ua: NEGATIVE
Nitrite: NEGATIVE
Protein, ur: 30 mg/dL — AB
Specific Gravity, Urine: 1.023 (ref 1.005–1.030)
pH: 7 (ref 5.0–8.0)

## 2021-11-18 LAB — LIPASE, BLOOD: Lipase: 1141 U/L — ABNORMAL HIGH (ref 11–51)

## 2021-11-18 MED ORDER — SODIUM CHLORIDE 0.9 % IV BOLUS (SEPSIS)
1000.0000 mL | Freq: Once | INTRAVENOUS | Status: AC
  Administered 2021-11-19: 1000 mL via INTRAVENOUS

## 2021-11-18 MED ORDER — IOHEXOL 300 MG/ML  SOLN
100.0000 mL | Freq: Once | INTRAMUSCULAR | Status: AC | PRN
  Administered 2021-11-18: 70 mL via INTRAVENOUS

## 2021-11-18 MED ORDER — SODIUM CHLORIDE 0.9 % IV BOLUS (SEPSIS)
1000.0000 mL | Freq: Once | INTRAVENOUS | Status: AC
  Administered 2021-11-18: 1000 mL via INTRAVENOUS

## 2021-11-18 MED ORDER — SODIUM CHLORIDE 0.9 % IV SOLN
1000.0000 mL | INTRAVENOUS | Status: DC
  Administered 2021-11-19 – 2021-11-20 (×5): 1000 mL via INTRAVENOUS

## 2021-11-18 MED ORDER — HYDROMORPHONE HCL 1 MG/ML IJ SOLN
0.5000 mg | Freq: Once | INTRAMUSCULAR | Status: AC
  Administered 2021-11-18: 0.5 mg via INTRAVENOUS
  Filled 2021-11-18: qty 1

## 2021-11-18 MED ORDER — ONDANSETRON HCL 4 MG/2ML IJ SOLN
4.0000 mg | Freq: Once | INTRAMUSCULAR | Status: AC
  Administered 2021-11-18: 4 mg via INTRAVENOUS
  Filled 2021-11-18: qty 2

## 2021-11-18 NOTE — ED Triage Notes (Signed)
Pt  here from home with c/o abd pain across the upper abd and radiates to her back , pt is bloated  some nausea but no vomiting

## 2021-11-19 ENCOUNTER — Observation Stay (HOSPITAL_COMMUNITY): Payer: BC Managed Care – PPO

## 2021-11-19 ENCOUNTER — Other Ambulatory Visit: Payer: Self-pay

## 2021-11-19 ENCOUNTER — Encounter (HOSPITAL_COMMUNITY): Payer: Self-pay | Admitting: Family Medicine

## 2021-11-19 DIAGNOSIS — F410 Panic disorder [episodic paroxysmal anxiety] without agoraphobia: Secondary | ICD-10-CM | POA: Diagnosis present

## 2021-11-19 DIAGNOSIS — E860 Dehydration: Secondary | ICD-10-CM | POA: Diagnosis present

## 2021-11-19 DIAGNOSIS — Z8719 Personal history of other diseases of the digestive system: Secondary | ICD-10-CM | POA: Diagnosis not present

## 2021-11-19 DIAGNOSIS — K8020 Calculus of gallbladder without cholecystitis without obstruction: Secondary | ICD-10-CM | POA: Diagnosis present

## 2021-11-19 DIAGNOSIS — F1090 Alcohol use, unspecified, uncomplicated: Secondary | ICD-10-CM | POA: Diagnosis present

## 2021-11-19 DIAGNOSIS — Z90710 Acquired absence of both cervix and uterus: Secondary | ICD-10-CM | POA: Diagnosis not present

## 2021-11-19 DIAGNOSIS — R197 Diarrhea, unspecified: Secondary | ICD-10-CM | POA: Diagnosis not present

## 2021-11-19 DIAGNOSIS — Z9049 Acquired absence of other specified parts of digestive tract: Secondary | ICD-10-CM | POA: Diagnosis not present

## 2021-11-19 DIAGNOSIS — K859 Acute pancreatitis without necrosis or infection, unspecified: Secondary | ICD-10-CM | POA: Diagnosis present

## 2021-11-19 DIAGNOSIS — E876 Hypokalemia: Secondary | ICD-10-CM | POA: Diagnosis present

## 2021-11-19 DIAGNOSIS — K8500 Idiopathic acute pancreatitis without necrosis or infection: Secondary | ICD-10-CM | POA: Diagnosis not present

## 2021-11-19 DIAGNOSIS — Z79899 Other long term (current) drug therapy: Secondary | ICD-10-CM | POA: Diagnosis not present

## 2021-11-19 DIAGNOSIS — G4700 Insomnia, unspecified: Secondary | ICD-10-CM | POA: Diagnosis present

## 2021-11-19 LAB — COMPREHENSIVE METABOLIC PANEL
ALT: 15 U/L (ref 0–44)
AST: 16 U/L (ref 15–41)
Albumin: 3.5 g/dL (ref 3.5–5.0)
Alkaline Phosphatase: 22 U/L — ABNORMAL LOW (ref 38–126)
Anion gap: 7 (ref 5–15)
BUN: 11 mg/dL (ref 8–23)
CO2: 25 mmol/L (ref 22–32)
Calcium: 7.9 mg/dL — ABNORMAL LOW (ref 8.9–10.3)
Chloride: 109 mmol/L (ref 98–111)
Creatinine, Ser: 0.63 mg/dL (ref 0.44–1.00)
GFR, Estimated: >60 mL/min (ref >60)
Glucose, Bld: 151 mg/dL — ABNORMAL HIGH (ref 70–99)
Potassium: 3.4 mmol/L — ABNORMAL LOW (ref 3.5–5.1)
Sodium: 141 mmol/L (ref 135–145)
Total Bilirubin: 0.8 mg/dL (ref 0.3–1.2)
Total Protein: 6 g/dL — ABNORMAL LOW (ref 6.5–8.1)

## 2021-11-19 LAB — CBC
HCT: 38.9 % (ref 36.0–46.0)
Hemoglobin: 12.8 g/dL (ref 12.0–15.0)
MCH: 31 pg (ref 26.0–34.0)
MCHC: 32.9 g/dL (ref 30.0–36.0)
MCV: 94.2 fL (ref 80.0–100.0)
Platelets: 151 10*3/uL (ref 150–400)
RBC: 4.13 MIL/uL (ref 3.87–5.11)
RDW: 12.6 % (ref 11.5–15.5)
WBC: 17.1 10*3/uL — ABNORMAL HIGH (ref 4.0–10.5)
nRBC: 0 % (ref 0.0–0.2)

## 2021-11-19 LAB — HIV ANTIBODY (ROUTINE TESTING W REFLEX): HIV Screen 4th Generation wRfx: NONREACTIVE

## 2021-11-19 LAB — TRIGLYCERIDES: Triglycerides: 33 mg/dL (ref <150)

## 2021-11-19 MED ORDER — ONDANSETRON HCL 4 MG/2ML IJ SOLN
4.0000 mg | Freq: Once | INTRAMUSCULAR | Status: AC
  Administered 2021-11-19: 4 mg via INTRAVENOUS

## 2021-11-19 MED ORDER — HYDROMORPHONE HCL 1 MG/ML IJ SOLN
0.5000 mg | INTRAMUSCULAR | Status: DC | PRN
  Administered 2021-11-19 – 2021-11-21 (×12): 1 mg via INTRAVENOUS
  Filled 2021-11-19 (×13): qty 1

## 2021-11-19 MED ORDER — STERILE WATER FOR INJECTION IJ SOLN
INTRAMUSCULAR | Status: AC
  Administered 2021-11-19: 10 mL
  Filled 2021-11-19: qty 10

## 2021-11-19 MED ORDER — ACETAMINOPHEN 325 MG PO TABS
650.0000 mg | ORAL_TABLET | Freq: Four times a day (QID) | ORAL | Status: DC | PRN
  Administered 2021-11-21 – 2021-11-23 (×4): 650 mg via ORAL
  Filled 2021-11-19 (×4): qty 2

## 2021-11-19 MED ORDER — ONDANSETRON HCL 4 MG PO TABS: 4.0000 mg | ORAL_TABLET | Freq: Four times a day (QID) | ORAL | Status: DC | PRN

## 2021-11-19 MED ORDER — HYDROMORPHONE HCL 1 MG/ML IJ SOLN
1.0000 mg | Freq: Once | INTRAMUSCULAR | Status: AC
  Administered 2021-11-19: 1 mg via INTRAVENOUS
  Filled 2021-11-19: qty 1

## 2021-11-19 MED ORDER — PANTOPRAZOLE SODIUM 40 MG IV SOLR
40.0000 mg | Freq: Every day | INTRAVENOUS | Status: DC
  Administered 2021-11-19 – 2021-11-22 (×4): 40 mg via INTRAVENOUS
  Filled 2021-11-19 (×4): qty 10

## 2021-11-19 MED ORDER — LIP MEDEX EX OINT
TOPICAL_OINTMENT | CUTANEOUS | Status: DC | PRN
  Administered 2021-11-19: 1 via TOPICAL
  Filled 2021-11-19: qty 7

## 2021-11-19 MED ORDER — ENOXAPARIN SODIUM 40 MG/0.4ML IJ SOSY
40.0000 mg | PREFILLED_SYRINGE | INTRAMUSCULAR | Status: DC
Start: 2021-11-19 — End: 2021-11-23
  Administered 2021-11-19 – 2021-11-23 (×5): 40 mg via SUBCUTANEOUS
  Filled 2021-11-19 (×5): qty 0.4

## 2021-11-19 MED ORDER — SODIUM CHLORIDE 0.9 % IV SOLN
6.2500 mg | Freq: Four times a day (QID) | INTRAVENOUS | Status: DC | PRN
  Administered 2021-11-19: 6.25 mg via INTRAVENOUS
  Filled 2021-11-19 (×6): qty 0.25

## 2021-11-19 MED ORDER — ONDANSETRON HCL 4 MG/2ML IJ SOLN
INTRAMUSCULAR | Status: AC
  Filled 2021-11-19: qty 2

## 2021-11-19 MED ORDER — ONDANSETRON HCL 4 MG/2ML IJ SOLN
4.0000 mg | Freq: Four times a day (QID) | INTRAMUSCULAR | Status: DC | PRN
  Administered 2021-11-19 (×2): 4 mg via INTRAVENOUS
  Filled 2021-11-19 (×3): qty 2

## 2021-11-19 MED ORDER — SODIUM CHLORIDE 0.9 % IV SOLN
12.5000 mg | Freq: Once | INTRAVENOUS | Status: AC
Start: 2021-11-19 — End: 2021-11-19
  Administered 2021-11-19: 12.5 mg via INTRAVENOUS
  Filled 2021-11-19: qty 12.5

## 2021-11-19 MED ORDER — ACETAMINOPHEN 650 MG RE SUPP: 650.0000 mg | Freq: Four times a day (QID) | RECTAL | Status: DC | PRN

## 2021-11-19 NOTE — Progress Notes (Addendum)
Patient is admitted early this morning for pancreatitis of unknown etiology, detail please see original H&P. Patient is seen and at the present of husband in room, continues to C/o diffuse ab pain, radiation to back and right chest /shoulder, Intermittent n/v,  bilious emesis   vital signs are stable CT did not show gallstone, will add on ab [place] . Continue npo, can have ice chips, continue hydration, prn analgesics , antiemetics .

## 2021-11-19 NOTE — Plan of Care (Signed)
  Problem: Education: Goal: Knowledge of General Education information will improve Description: Including pain rating scale, medication(s)/side effects and non-pharmacologic comfort measures Outcome: Progressing   Problem: Clinical Measurements: Goal: Ability to maintain clinical measurements within normal limits will improve Outcome: Progressing   Problem: Pain Managment: Goal: General experience of comfort will improve Outcome: Progressing   

## 2021-11-19 NOTE — Assessment & Plan Note (Addendum)
Possibly EtOH pancreatitis though only drinks 1-2 glasses of wine a day. Gallbladder and bile ducts looked unremarkable on imaging. 1. NPO 2. IVF 3. zofran prn 4. Dilaudid PRN 5. Triglycerides drawn and pending 6. WBC 17k, no other SIRS 1. Holding off on ABx for the moment. 7. Repeat CBC / CMP in AM

## 2021-11-19 NOTE — Progress Notes (Signed)
Rn notified MD of need for additional nausea medication. Orders placed by provider in EMR.

## 2021-11-19 NOTE — Plan of Care (Signed)
  Problem: Clinical Measurements: Goal: Diagnostic test results will improve Outcome: Progressing   Problem: Clinical Measurements: Goal: Respiratory complications will improve Outcome: Progressing   Problem: Clinical Measurements: Goal: Cardiovascular complication will be avoided Outcome: Progressing   Problem: Elimination: Goal: Will not experience complications related to bowel motility Outcome: Progressing   

## 2021-11-19 NOTE — H&P (Signed)
History and Physical    Patient: Alexis Lin DOB: [birth year] DOA: 11/18/2021 DOS: the patient was seen and examined on 11/19/2021 PCP: Vida Rigger, MD  Patient coming from: Home  Chief Complaint:  Chief Complaint  Patient presents with   Abdominal Pain   HPI: Alexis Lin is a 61 y.o. female with medical history significant of panic attacks and not much else.  Pt drinks 1-2 glasses of wine a day.  Presents to ED with c/o 1 day history of onset and gradual worsening of epigastric abd pain, radiating to back.  Associated nausea but no vomiting.  Pain worse with movement and palpation.  No suspicious food intake.  No recent fever.  No h/o gallbladder issues.  Appendicitis in 2019.  Review of Systems: As mentioned in the history of present illness. All other systems reviewed and are negative. Past Medical History:  Diagnosis Date   Ejection fraction    60-65%, echo, August 01, 2010   Family history of aneurysm    The patient's mother had an ascending aortic aneurysm.  There was no specific pathology.  The aortic valve was effected .   Insomnia    Mitral click    Late systolic click on physical exam, April, 2012,.  There is no murmur.  There is no mitral valve prolapse by echo   Palpitations    April, 2012   Panic attacks    2012   Venous disease    Very slight superficial venous changes at the ankle.. No significant abnormality   Past Surgical History:  Procedure Laterality Date   CESAREAN SECTION  1985   hysterectomy unknown type  1995   LUMBAR LAMINECTOMY/DECOMPRESSION MICRODISCECTOMY Left 11/28/2017   Procedure: Left Lumbar Two-Three extraforaminal microdiscectomy;  Surgeon: Shirlean Kelly, MD;  Location: [facility] OR;  Service: Neurosurgery;  Laterality: Left;  Left Lumbar Two-Three extraforaminal microdiscectomy   TONSILLECTOMY  1963   Social History:  reports that she has never smoked. She does not have any smokeless tobacco history on file. She reports  current alcohol use. She reports that she does not use drugs.  No Known Allergies  Family History  Problem Relation Age of Onset   Aneurysm Unknown    Hypertension Unknown     Prior to Admission medications   Medication Sig Start Date End Date Taking? Authorizing Provider  escitalopram (LEXAPRO) 10 MG tablet Take 10 mg by mouth Daily.  09/07/11   [provider]  HYDROcodone-acetaminophen (NORCO/VICODIN) 5-325 MG tablet Take 1-2 tablets by mouth every 4 (four) hours as needed (pain). 11/29/17   Shirlean Kelly, MD  Multiple Vitamins-Minerals (ONE-A-DAY WOMENS VITACRAVES) CHEW Chew 1 each by mouth daily.    [provider]  polyethylene glycol (MIRALAX / GLYCOLAX) packet Take 8.5 g by mouth daily.    [provider]  zolpidem (AMBIEN CR) 6.25 MG CR tablet Take 6.25 mg by mouth at bedtime as needed.  09/16/11   [provider]    Physical Exam: Vitals:   11/18/21 2204 11/18/21 2300 11/19/21 0000 11/19/21 0201  BP: (!) 151/83 133/82 137/87 131/80  Pulse: (!) 103 (!) 107 99 91  Resp: 20 18 18 16   Temp:    (!) 97.4 F (36.3 C)  TempSrc:    Oral  SpO2: 97% 97% 97% 93%  Weight:      Height:       Constitutional: NAD, calm, comfortable Eyes: PERRL, lids and conjunctivae normal ENMT: Mucous membranes are moist. Posterior pharynx clear of  any exudate or lesions.Normal dentition.  Neck: normal, supple, no masses, no thyromegaly Respiratory: clear to auscultation bilaterally, no wheezing, no crackles. Normal respiratory effort. No accessory muscle use.  Cardiovascular: Regular rate and rhythm, no murmurs / rubs / gallops. No extremity edema. 2+ pedal pulses. No carotid bruits.  Abdomen: epigastric TTP with guarding Musculoskeletal: no clubbing / cyanosis. No joint deformity upper and lower extremities. Good ROM, no contractures. Normal muscle tone.  Skin: no rashes, lesions, ulcers. No induration Neurologic: CN 2-12 grossly intact. Sensation intact, DTR  normal. Strength 5/5 in all 4.  Psychiatric: Normal judgment and insight. Alert and oriented x 3. Normal mood.   Data Reviewed:    Lipase 1141  CT AP: IMPRESSION: Acute pancreatitis. No drainable fluid collection/walled-off necrosis.   Secondary inflammatory changes involving the duodenum and hepatic flexure of the colon.        Latest Ref Rng & Units 11/18/2021    9:02 PM 11/28/2017    2:28 PM 08/16/2010    3:07 PM  CBC  WBC 4.0 - 10.5 K/uL 17.0  16.0  5.7   Hemoglobin 12.0 - 15.0 g/dL 08.6  57.8  46.9   Hematocrit 36.0 - 46.0 % 41.5  43.7  37.0   Platelets 150 - 400 K/uL 173  237  173.0       Latest Ref Rng & Units 11/18/2021    9:02 PM 11/28/2017    2:28 PM 08/16/2010    3:07 PM  CMP  Glucose 70 - 99 mg/dL 629  528  76   BUN 8 - 23 mg/dL 12  15  16    Creatinine 0.44 - 1.00 mg/dL  4.13  0.8   Sodium 135 - 145 mmol/L 138  140  143   Potassium 3.5 - 5.1 mmol/L 3.4  2.8  3.3   Chloride 98 - 111 mmol/L 99  104  102   CO2 22 - 32 mmol/L 26  21  33   Calcium 8.9 - 10.3 mg/dL 9.6  9.2  9.5   Total Protein 6.5 - 8.1 g/dL 7.1     Total Bilirubin 0.3 - 1.2 mg/dL 1.0     Alkaline Phos 38 - 126 U/L 24     AST 15 - 41 U/L 15     ALT 0 - 44 U/L 13        Assessment and Plan: * Acute pancreatitis Possibly EtOH pancreatitis though only drinks 1-2 glasses of wine a day. Gallbladder and bile ducts looked unremarkable on imaging. NPO IVF zofran prn Dilaudid PRN Triglycerides drawn and pending WBC 17k, no other SIRS Holding off on ABx for the moment. Repeat CBC / CMP in AM  Panic attacks Cont home meds after med rec completed.      Advance Care Planning:   Code Status: Full Code  Consults: none  Family Communication: No family in room  Severity of Illness: The appropriate patient status for this patient is OBSERVATION. Observation status is judged to be reasonable and necessary in order to provide the required intensity of service to ensure the patient's safety.  The patient's presenting symptoms, physical exam findings, and initial radiographic and laboratory data in the context of their medical condition is felt to place them at decreased risk for further clinical deterioration. Furthermore, it is anticipated that the patient will be medically stable for discharge from the hospital within 2 midnights of admission.   Author: 2.44., DO 11/19/2021 2:37 AM  For on call  review [email or web address].

## 2021-11-19 NOTE — Assessment & Plan Note (Signed)
Cont home meds after med rec completed.

## 2021-11-19 NOTE — ED Provider Notes (Signed)
[facility] EMERGENCY DEPT Provider Note  CSN: [id number] Arrival date & time: 11/18/21 2046  Chief Complaint(s) Abdominal Pain  HPI Alexis Lin is a 61 y.o. female with no pertinent past medical history who presents to the emergency department for 1 day of gradually worsening epigastric abdominal pain radiating to the back with associated abdominal bloating.  She endorses nausea without emesis.  Pain worse with movement and palpation.  Alleviated by certain positions.  Denies any suspicious food intake.  No recent fevers or infections.  Patient does drink wine frequently, 1 to 2 glasses of wine with dinner.  Reports that she did not drink much over the past week.  Denies any gallbladder issues.  The history is provided by the patient.    Past Medical History Past Medical History:  Diagnosis Date   Ejection fraction    60-65%, echo, August 01, 2010   Family history of aneurysm    The patient's mother had an ascending aortic aneurysm.  There was no specific pathology.  The aortic valve was effected .   Insomnia    Mitral click    Late systolic click on physical exam, April, 2012,.  There is no murmur.  There is no mitral valve prolapse by echo   Palpitations    April, 2012   Panic attacks    2012   Venous disease    Very slight superficial venous changes at the ankle.. No significant abnormality   Patient Active Problem List   Diagnosis Date Noted   HNP (herniated nucleus pulposus), lumbar 11/28/2017   Panic attacks    Venous disease    Ejection fraction    Palpitations    Insomnia    Mitral click    Family history of aneurysm    Home Medication(s) Prior to Admission medications   Medication Sig Start Date End Date Taking? Authorizing Provider  escitalopram (LEXAPRO) 10 MG tablet Take 10 mg by mouth Daily.  09/07/11   [provider]  HYDROcodone-acetaminophen (NORCO/VICODIN) 5-325 MG tablet Take 1-2 tablets by mouth every 4 (four) hours as  needed (pain). 11/29/17   Jovita Gamma, MD  Multiple Vitamins-Minerals (ONE-A-DAY WOMENS VITACRAVES) CHEW Chew 1 each by mouth daily.    [provider]  polyethylene glycol (MIRALAX / GLYCOLAX) packet Take 8.5 g by mouth daily.    [provider]  zolpidem (AMBIEN CR) 6.25 MG CR tablet Take 6.25 mg by mouth at bedtime as needed.  09/16/11   [provider]                                                                                                                                    Allergies Patient has no known allergies.  Review of Systems Review of Systems As noted in HPI  Physical Exam Vital Signs  I have reviewed the triage vital signs BP 137/87   Pulse 99   Temp 98.5 F (  36.9 C)   Resp 18   Ht 5' 1.5" (1.562 m)   Wt 50.3 kg   SpO2 97%   BMI 20.61 kg/m   Physical Exam Vitals reviewed.  Constitutional:      General: She is not in acute distress.    Appearance: She is well-developed. She is not diaphoretic.  HENT:     Head: Normocephalic and atraumatic.     Right Ear: External ear normal.     Left Ear: External ear normal.     Nose: Nose normal.  Eyes:     General: No scleral icterus.    Conjunctiva/sclera: Conjunctivae normal.  Neck:     Trachea: Phonation normal.  Cardiovascular:     Rate and Rhythm: Normal rate and regular rhythm.  Pulmonary:     Effort: Pulmonary effort is normal. No respiratory distress.     Breath sounds: No stridor.  Abdominal:     General: There is distension.     Tenderness: There is abdominal tenderness in the epigastric area. There is guarding. There is no rebound.  Musculoskeletal:        General: Normal range of motion.     Cervical back: Normal range of motion.  Neurological:     Mental Status: She is alert and oriented to person, place, and time.  Psychiatric:        Behavior: Behavior normal.     ED Results and Treatments Labs (all labs ordered are listed, but only abnormal results are  displayed) Labs Reviewed  LIPASE, BLOOD - Abnormal; Notable for the following components:      Result Value   Lipase 1,141 (*)    All other components within normal limits  COMPREHENSIVE METABOLIC PANEL - Abnormal; Notable for the following components:   Potassium 3.4 (*)    Glucose, Bld 141 (*)    Alkaline Phosphatase 24 (*)    All other components within normal limits  CBC - Abnormal; Notable for the following components:   WBC 17.0 (*)    All other components within normal limits  URINALYSIS, ROUTINE W REFLEX MICROSCOPIC - Abnormal; Notable for the following components:   Ketones, ur 40 (*)    Protein, ur 30 (*)    All other components within normal limits  TRIGLYCERIDES                                                                                                                         EKG  EKG Interpretation  Date/Time:  Saturday November 18 2021 21:08:02 EDT Ventricular Rate:  103 PR Interval:  154 QRS Duration: 66 QT Interval:  326 QTC Calculation: 427 R Axis:   74 Text Interpretation: Sinus tachycardia Biatrial enlargement Abnormal ECG No previous ECGs available Confirmed by Drema Pry [phone]) on 11/19/2021 12:03:03 AM       Radiology CT ABDOMEN PELVIS W CONTRAST  Result Date: 11/18/2021 CLINICAL DATA:  Abdominal pain, pancreatitis EXAM: CT ABDOMEN AND PELVIS WITH CONTRAST TECHNIQUE: Multidetector CT  imaging of the abdomen and pelvis was performed using the standard protocol following bolus administration of intravenous contrast. RADIATION DOSE REDUCTION: This exam was performed according to the departmental dose-optimization program which includes automated exposure control, adjustment of the mA and/or kV according to patient size and/or use of iterative reconstruction technique. CONTRAST:  21mL OMNIPAQUE IOHEXOL 300 MG/ML  SOLN COMPARISON:  None Available. FINDINGS: Lower chest: Lung bases are clear. Hepatobiliary: Liver is within normal limits. Gallbladder is  unremarkable. No intrahepatic or extrahepatic ductal dilatation. Pancreas: Peripancreatic inflammatory changes/fluid inferiorly along the pancreatic head and anterior aspect of the uncinate process, suggesting acute pancreatitis. No pancreatic ductal dilatation. No drainable fluid collection/walled-off necrosis. Spleen: Within normal limits. Adrenals/Urinary Tract: Adrenal glands are within normal limits. Kidneys are within normal limits.  No hydronephrosis. Bladder is underdistended but unremarkable. Stomach/Bowel: Stomach is within normal limits. Inflammatory changes along the 2nd and 3rd portion of the duodenum (series 2/image 33), suggesting duodenitis. Mildly prominent loops of small bowel in the left mid abdomen (series 2/image 38) likely reflect adynamic ileus. Status post appendectomy. Secondary inflammatory changes involving the hepatic flexure of the colon (series 2/image 36). Vascular/Lymphatic: No evidence of abdominal aortic aneurysm. No suspicious abdominopelvic lymphadenopathy. Reproductive: Status post hysterectomy. Left ovary is within normal limits.  No right adnexal mass. Other: Small volume pelvic ascites. Musculoskeletal: Status post PLIF at L5-S1 with grade 2 spondylolisthesis. Mild degenerative changes at L4-5. IMPRESSION: Acute pancreatitis. No drainable fluid collection/walled-off necrosis. Secondary inflammatory changes involving the duodenum and hepatic flexure of the colon. Electronically Signed   By: Charline Bills M.D.   On: 11/18/2021 23:49    Pertinent labs & imaging results that were available during my care of the patient were reviewed by me and considered in my medical decision making (see MDM for details).  Medications Ordered in ED Medications  sodium chloride 0.9 % bolus 1,000 mL (1,000 mLs Intravenous New Bag/Given 11/18/21 2323)    Followed by  sodium chloride 0.9 % bolus 1,000 mL (has no administration in time range)    Followed by  0.9 %  sodium chloride infusion  (has no administration in time range)  HYDROmorphone (DILAUDID) injection 0.5 mg (0.5 mg Intravenous Given 11/18/21 2323)  ondansetron (ZOFRAN) injection 4 mg (4 mg Intravenous Given 11/18/21 2323)  iohexol (OMNIPAQUE) 300 MG/ML solution 100 mL (70 mLs Intravenous Contrast Given 11/18/21 2330)                                                                                                                                     Procedures Procedures  (including critical care time)  Medical Decision Making / ED Course    Complexity of Problem:  Patient's presenting problem/concern, DDX, and MDM listed below: Epigastric abdominal pain We will assess for any intra-abdominal inflammatory/infectious process including acute cholecystitis, pancreatitis.  Hospitalization Considered:  Yes  Initial Intervention:  IV fluids and pain medicine  Complexity of Data:    Laboratory Tests ordered listed below with my independent interpretation: CBC with leukocytosis.  No anemia. No significant electrolyte derangements or renal sufficiency.  No evidence of biliary obstruction. Patient does have evidence of pancreatitis with a lipase greater than 1100   Imaging Studies ordered listed below with my independent interpretation: CT scan does show evidence of pancreatitis. Radiology confirmed and reported no abscess or necrosis.     ED Course:    Assessment, Add'l Intervention, and Reassessment: Pancreatitis Possibly alcohol-related. Ordered triglycerides. Patient receiving high-volume IV fluids and getting IV pain medicine. She will require admission to the hospital for further work-up and management. I spoke with Dr. Myna Hidalgo from the hospitalist service who agreed to admit patient.    Final Clinical Impression(s) / ED Diagnoses Final diagnoses:  Acute pancreatitis without infection or necrosis, unspecified pancreatitis type           This chart was dictated using voice recognition  software.  Despite best efforts to proofread,  errors can occur which can change the documentation meaning.    Fatima Blank, MD 11/19/21 [phone]

## 2021-11-20 DIAGNOSIS — E876 Hypokalemia: Secondary | ICD-10-CM

## 2021-11-20 DIAGNOSIS — D72829 Elevated white blood cell count, unspecified: Secondary | ICD-10-CM

## 2021-11-20 DIAGNOSIS — K8500 Idiopathic acute pancreatitis without necrosis or infection: Secondary | ICD-10-CM | POA: Diagnosis not present

## 2021-11-20 DIAGNOSIS — Z789 Other specified health status: Secondary | ICD-10-CM

## 2021-11-20 LAB — CBC WITH DIFFERENTIAL/PLATELET
Abs Immature Granulocytes: 0.27 10*3/uL — ABNORMAL HIGH (ref 0.00–0.07)
Basophils Absolute: 0 10*3/uL (ref 0.0–0.1)
Basophils Relative: 0 %
Eosinophils Absolute: 0 10*3/uL (ref 0.0–0.5)
Eosinophils Relative: 0 %
HCT: 37.9 % (ref 36.0–46.0)
Hemoglobin: 12.1 g/dL (ref 12.0–15.0)
Immature Granulocytes: 1 %
Lymphocytes Relative: 3 %
Lymphs Abs: 0.7 10*3/uL (ref 0.7–4.0)
MCH: 30.8 pg (ref 26.0–34.0)
MCHC: 31.9 g/dL (ref 30.0–36.0)
MCV: 96.4 fL (ref 80.0–100.0)
Monocytes Absolute: 1.1 10*3/uL — ABNORMAL HIGH (ref 0.1–1.0)
Monocytes Relative: 6 %
Neutro Abs: 18.6 10*3/uL — ABNORMAL HIGH (ref 1.7–7.7)
Neutrophils Relative %: 90 %
Platelets: 149 10*3/uL — ABNORMAL LOW (ref 150–400)
RBC: 3.93 MIL/uL (ref 3.87–5.11)
RDW: 12.9 % (ref 11.5–15.5)
WBC: 20.7 10*3/uL — ABNORMAL HIGH (ref 4.0–10.5)
nRBC: 0 % (ref 0.0–0.2)

## 2021-11-20 LAB — BASIC METABOLIC PANEL
Anion gap: 7 (ref 5–15)
BUN: 8 mg/dL (ref 8–23)
CO2: 21 mmol/L — ABNORMAL LOW (ref 22–32)
Calcium: 7 mg/dL — ABNORMAL LOW (ref 8.9–10.3)
Chloride: 113 mmol/L — ABNORMAL HIGH (ref 98–111)
Creatinine, Ser: 0.6 mg/dL (ref 0.44–1.00)
GFR, Estimated: >60 mL/min (ref >60)
Glucose, Bld: 88 mg/dL (ref 70–99)
Potassium: 3.5 mmol/L (ref 3.5–5.1)
Sodium: 141 mmol/L (ref 135–145)

## 2021-11-20 LAB — LIPASE, BLOOD: Lipase: 85 U/L — ABNORMAL HIGH (ref 11–51)

## 2021-11-20 LAB — MAGNESIUM: Magnesium: 2.1 mg/dL (ref 1.7–2.4)

## 2021-11-20 MED ORDER — GABAPENTIN 300 MG PO CAPS
600.0000 mg | ORAL_CAPSULE | Freq: Every day | ORAL | Status: DC
  Administered 2021-11-20 – 2021-11-22 (×3): 600 mg via ORAL
  Filled 2021-11-20 (×3): qty 2

## 2021-11-20 MED ORDER — ESCITALOPRAM OXALATE 10 MG PO TABS
5.0000 mg | ORAL_TABLET | Freq: Every day | ORAL | Status: DC
  Administered 2021-11-23: 5 mg via ORAL
  Filled 2021-11-20 (×4): qty 1

## 2021-11-20 MED ORDER — CYCLOSPORINE 0.05 % OP EMUL
1.0000 [drp] | Freq: Two times a day (BID) | OPHTHALMIC | Status: DC
Start: 2021-11-20 — End: 2021-11-23
  Filled 2021-11-20 (×6): qty 30

## 2021-11-20 MED ORDER — POTASSIUM CHLORIDE 2 MEQ/ML IV SOLN
INTRAVENOUS | Status: DC
  Filled 2021-11-20 (×7): qty 1000

## 2021-11-20 MED ORDER — POLYETHYLENE GLYCOL 3350 17 G PO PACK
17.0000 g | PACK | Freq: Every day | ORAL | Status: DC
  Administered 2021-11-21 – 2021-11-23 (×3): 17 g via ORAL
  Filled 2021-11-20 (×3): qty 1

## 2021-11-20 MED ORDER — POTASSIUM CHLORIDE 2 MEQ/ML IV SOLN: INTRAVENOUS | Status: DC

## 2021-11-20 NOTE — Consult Note (Signed)
Referring Provider: [facility] Primary Care Physician:  Clarene Essex, MD Primary Gastroenterologist:  Dr. Watt Climes  Reason for Consultation: Abdominal pain, pancreatitis  HPI: Alexis Lin is a 61 y.o. female  past medical history insomnia and panic attacks who presented to the ED 11/19/2021 with abdominal pain for 1 day.  She notes she felt somewhat ill Friday night when going to bed.  She woke up with intense pain Saturday.  She had some associated nausea denies vomiting.  Notes her pain was generalized throughout her abdomen.  Worse with coughing, laughing, eating.  She attempted to eat chicken noodle soup on Saturday but was unable to finish.  She denies any previous episodes of abdominal pain or pancreatitis.  Denies melena, hematochezia, hematemesis.  She drinks alcohol 1-2 times a week, wine.  Denies liquor or beer. Denies any new medications starting. Denies family history of gallbladder issues.  No family history of colon cancer.    Colonoscopy  09/14/2020 Polypoid mucosa, recall 5 years For personal history of colon polyp   Past Medical History:  Diagnosis Date   Ejection fraction    60-65%, echo, August 01, 2010   Family history of aneurysm    The patient's mother had an ascending aortic aneurysm.  There was no specific pathology.  The aortic valve was effected .   Insomnia    Mitral click    Late systolic click on physical exam, April, 2012,.  There is no murmur.  There is no mitral valve prolapse by echo   Palpitations    April, 2012   Panic attacks    2012   Venous disease    Very slight superficial venous changes at the ankle.. No significant abnormality    Past Surgical History:  Procedure Laterality Date   APPENDECTOMY     CESAREAN SECTION  05/01/1983   hysterectomy unknown type  04/30/1993   LUMBAR LAMINECTOMY/DECOMPRESSION MICRODISCECTOMY Left 11/28/2017   Procedure: Left Lumbar Two-Three extraforaminal microdiscectomy;  Surgeon: Jovita Gamma, MD;  Location: [facility];   Service: Neurosurgery;  Laterality: Left;  Left Lumbar Two-Three extraforaminal microdiscectomy   TONSILLECTOMY  04/30/1961    Prior to Admission medications   Medication Sig Start Date End Date Taking? Authorizing Provider  CALCIUM PO Take 2 tablets by mouth daily.   Yes [provider]  [employer] PURELAX 17 GM/SCOOP powder Take 8.5 g by mouth daily. 08/18/21  Yes [provider]  denosumab (PROLIA) 60 MG/ML SOSY injection Inject 60 mg into the skin every 6 (six) months. 07/10/21  Yes [provider]  escitalopram (LEXAPRO) 10 MG tablet Take 5 mg by mouth daily. 09/07/11  Yes [provider]  gabapentin (NEURONTIN) 300 MG capsule Take 600 mg by mouth at bedtime. 08/31/21  Yes [provider]  ibuprofen (ADVIL) 200 MG tablet Take 400 mg by mouth daily as needed for headache (pain).   Yes [provider]  meloxicam (MOBIC) 15 MG tablet Take 15 mg by mouth daily. 11/16/21  Yes [provider]  Multiple Vitamins-Minerals (ONE-A-DAY WOMENS VITACRAVES) CHEW Chew 2 tablets by mouth daily.   Yes [provider]  OVER THE COUNTER MEDICATION Take 1,000 mg by mouth daily. Tumeric Liquid supplement   Yes [provider]  RESTASIS 0.05 % ophthalmic emulsion Place 1 drop into both eyes 2 (two) times daily. 08/18/21  Yes [provider]  zolpidem (AMBIEN) 10 MG tablet Take 3.3 mg by mouth at bedtime as needed for sleep.   Yes [provider]  polyethylene glycol (MIRALAX / GLYCOLAX) packet Take 8.5 g by mouth daily. Patient not taking: Reported on 11/19/2021    [provider]  traMADol (ULTRAM) 50 MG tablet Take 50 mg by mouth 3 (three) times daily as needed. Patient not taking: Reported on 11/19/2021 10/02/21   [provider]    Scheduled Meds:  enoxaparin (LOVENOX) injection  40 mg Subcutaneous Q24H   pantoprazole (PROTONIX) IV  40 mg Intravenous Daily   Continuous Infusions:  dextrose 5 % and 0.9%  NaCl 1,000 mL with potassium chloride 10 mEq infusion     promethazine (PHENERGAN) injection (IM or IVPB) Stopped (11/19/21 1131)   PRN Meds:.acetaminophen **OR** acetaminophen, HYDROmorphone (DILAUDID) injection, lip balm, ondansetron **OR** ondansetron (ZOFRAN) IV, promethazine (PHENERGAN) injection (IM or IVPB)  Allergies as of 11/18/2021   (No Known Allergies)    Family History  Problem Relation Age of Onset   Aneurysm Unknown    Hypertension Unknown     Social History   Socioeconomic History   Marital status: Married    Spouse name: Not on file   Number of children: Not on file   Years of education: Not on file   Highest education level: Not on file  Occupational History   Occupation: Glass blower/designer  Tobacco Use   Smoking status: Never   Smokeless tobacco: Not on file  Substance and Sexual Activity   Alcohol use: Yes    Comment: socially   Drug use: No   Sexual activity: Not on file  Other Topics Concern   Not on file  Social History Narrative   Not on file   Social Determinants of Health   Financial Resource Strain: Not on file  Food Insecurity: Not on file  Transportation Needs: Not on file  Physical Activity: Not on file  Stress: Not on file  Social Connections: Not on file  Intimate Partner Violence: Not on file    Review of Systems: Review of Systems  Constitutional:  Negative for chills and fever.  HENT:  Negative for hearing loss and tinnitus.   Eyes:  Negative for blurred vision and double vision.  Respiratory:  Negative for cough and hemoptysis.   Cardiovascular:  Negative for chest pain and palpitations.  Gastrointestinal:  Positive for abdominal pain and nausea. Negative for blood in stool, constipation, diarrhea, heartburn, melena and vomiting.  Genitourinary:  Negative for dysuria and urgency.  Musculoskeletal:  Negative for myalgias and neck pain.  Skin:  Negative for itching and rash.  Neurological:  Negative for dizziness and headaches.   Endo/Heme/Allergies:  Negative for environmental allergies. Does not bruise/bleed easily.  Psychiatric/Behavioral:  Negative for depression and substance abuse.      Physical Exam:Physical Exam Constitutional:      General: She is not in acute distress.    Appearance: Normal appearance. She is normal weight.  HENT:     Head: Normocephalic and atraumatic.     Right Ear: External ear normal.     Left Ear: External ear normal.     Nose: Nose normal.     Mouth/Throat:     Mouth: Mucous membranes are moist.  Eyes:     Pupils: Pupils are equal, round, and reactive to light.  Cardiovascular:     Rate and Rhythm: Normal rate and regular rhythm.     Pulses: Normal pulses.     Heart sounds: Normal heart sounds.  Pulmonary:     Effort: Pulmonary effort is normal.     Breath sounds: Normal breath  sounds.  Abdominal:     General: Abdomen is flat. Bowel sounds are normal. There is distension.     Palpations: Abdomen is soft. There is no mass.     Tenderness: There is abdominal tenderness (diffuse). There is guarding. There is no rebound.     Hernia: No hernia is present.  Musculoskeletal:        General: No swelling. Normal range of motion.     Cervical back: Normal range of motion and neck supple.  Skin:    General: Skin is warm and dry.     Coloration: Skin is not jaundiced.  Neurological:     General: No focal deficit present.     Mental Status: She is alert and oriented to person, place, and time. Mental status is at baseline.  Psychiatric:        Mood and Affect: Mood normal.        Behavior: Behavior normal.     Vital signs: Vitals:   11/19/21 2213 11/20/21 0507  BP: 114/68 105/61  Pulse: (!) 103 99  Resp: 18 18  Temp: 97.9 F (36.6 C) 98 F (36.7 C)  SpO2: 94% 95%   Last BM Date : 11/18/21    GI:  Lab Results: Recent Labs    11/18/21 2102 11/19/21 0334 11/20/21 0326  WBC 17.0* 17.1* 20.7*  HGB 14.0 12.8 12.1  HCT 41.5 38.9 37.9  PLT 173 151 149*    BMET Recent Labs    11/18/21 2102 11/19/21 0334 11/20/21 0326  NA 138 141 141  K 3.4* 3.4* 3.5  CL 99 109 113*  CO2 26 25 21*  GLUCOSE 141* 151* 88  BUN _0 CREATININE 0.79 0.63 0.60  CALCIUM 9.6 7.9* 7.0*   LFT Recent Labs    11/19/21 0334  PROT 6.0*  ALBUMIN 3.5  AST 16  ALT 15  ALKPHOS 22*  BILITOT 0.8   PT/INR No results for input(s): "LABPROT", "INR" in the last 72 hours.   Studies/Results: US Abdomen Limited  Result Date: 11/19/2021 CLINICAL DATA:  Pancreatitis.  Evaluate for gallstones. EXAM: ULTRASOUND ABDOMEN LIMITED RIGHT UPPER QUADRANT COMPARISON:  CT abdomen and pelvis 11/18/2021 FINDINGS: Gallbladder: There is echogenic nonshadowing sludge within the gallbladder. No gallstones no pericholecystic fluid, gallbladder wall thickening, or sonographic Murphy's sign. Common bile duct: Diameter: 2.0 mm within normal limits. No intrahepatic or extrahepatic biliary ductal dilatation. Liver: No focal lesion identified. Within normal limits in parenchymal echogenicity. Portal vein is patent on color Doppler imaging with normal direction of blood flow towards the liver. Other: Minimal ascites. IMPRESSION: Gallbladder sludge but no gallstones. No sonographic evidence of acute cholecystitis. Electronically Signed   By: Yvonne Kendall M.D.   On: 11/19/2021 11:30   CT ABDOMEN PELVIS W CONTRAST  Result Date: 11/18/2021 CLINICAL DATA:  Abdominal pain, pancreatitis EXAM: CT ABDOMEN AND PELVIS WITH CONTRAST TECHNIQUE: Multidetector CT imaging of the abdomen and pelvis was performed using the standard protocol following bolus administration of intravenous contrast. RADIATION DOSE REDUCTION: This exam was performed according to the departmental dose-optimization program which includes automated exposure control, adjustment of the mA and/or kV according to patient size and/or use of iterative reconstruction technique. CONTRAST:  32m OMNIPAQUE IOHEXOL 300 MG/ML  SOLN COMPARISON:   None Available. FINDINGS: Lower chest: Lung bases are clear. Hepatobiliary: Liver is within normal limits. Gallbladder is unremarkable. No intrahepatic or extrahepatic ductal dilatation. Pancreas: Peripancreatic inflammatory changes/fluid inferiorly along the pancreatic head and anterior aspect of the uncinate  process, suggesting acute pancreatitis. No pancreatic ductal dilatation. No drainable fluid collection/walled-off necrosis. Spleen: Within normal limits. Adrenals/Urinary Tract: Adrenal glands are within normal limits. Kidneys are within normal limits.  No hydronephrosis. Bladder is underdistended but unremarkable. Stomach/Bowel: Stomach is within normal limits. Inflammatory changes along the 2nd and 3rd portion of the duodenum (series 2/image 33), suggesting duodenitis. Mildly prominent loops of small bowel in the left mid abdomen (series 2/image 38) likely reflect adynamic ileus. Status post appendectomy. Secondary inflammatory changes involving the hepatic flexure of the colon (series 2/image 36). Vascular/Lymphatic: No evidence of abdominal aortic aneurysm. No suspicious abdominopelvic lymphadenopathy. Reproductive: Status post hysterectomy. Left ovary is within normal limits.  No right adnexal mass. Other: Small volume pelvic ascites. Musculoskeletal: Status post PLIF at L5-S1 with grade 2 spondylolisthesis. Mild degenerative changes at L4-5. IMPRESSION: Acute pancreatitis. No drainable fluid collection/walled-off necrosis. Secondary inflammatory changes involving the duodenum and hepatic flexure of the colon. Electronically Signed   By: Julian Hy M.D.   On: 11/18/2021 23:49    Impression: Acute pancreatitis  Lipase 11/18/2021 1141 Hgb 12.1 AST 16, ALT 15, alk phos 22, bilirubin 0.8 Triglycerides 33  Right upper quadrant ultrasound 11/19/2021 Gallbladder sludge but no gallstones. No sonographic evidence of acute cholecystitis  CT abdomen pelvis with contrast 11/18/2021 Acute  pancreatitis. No drainable fluid collection/walled-off necrosis. Secondary inflammatory changes involving the duodenum and hepatic flexure of the colon.  Patient without significant history of alcohol use.  No evidence of cholelithiasis.  Possible pancreatitis due to gallbladder sludge noted on right upper quadrant ultrasound.  No elevation of triglycerides.  We will evaluate for autoimmune pancreatitis.  Plan: Continue IV fluids and pain control as needed. Will order ANA and IgG4 for evaluation of autoimmune pancreatitis Advance diet as tolerated, starting clear liquid diet  [facility] will follow  LOS: 1 day   Charlott Rakes  PA-C 11/20/2021, 10:58 AM  Contact #  [phone]

## 2021-11-20 NOTE — Progress Notes (Signed)
Transition of Care [facility]) Screening Note  Patient Details  Name: Alexis Lin Date of Birth: 04-Apr-1961  Transition of Care [facility]) CM/SW Contact:    Ewing Schlein, LCSW Phone Number: 11/20/2021, 9:35 AM  Transition of Care Department [facility]) has reviewed patient and no TOC needs have been identified at this time. We will continue to monitor patient advancement through interdisciplinary progression rounds. If new patient transition needs arise, please place a TOC consult.

## 2021-11-20 NOTE — Progress Notes (Signed)
Mobility Specialist - Progress Note    11/20/21 1043  Mobility  Activity Ambulated with assistance to bathroom  Level of Assistance Moderate assist, patient does 50-74%  Assistive Device Front wheel walker  Distance Ambulated (ft) 15 ft  Activity Response Tolerated well  $Mobility charge 1 Mobility   Patient was agreeable to be mobilized to the bathroom. Patient log rolled self to side of bed independently. After sitting up on EOB Pt reported feeling nauseous and light headed. Afterwards Pt used RW to ambulate to the bathroom. Pt took a break before reaching the bathroom and complained of abdominal pain 6/10. After the bathroom Pt was left on chair, while sitting down on chair Pt reported feeling nauseous and lightheaded again. Pt was left on chair with chair alarm on and with all necessities in reach. RN was notified of session.   Billey Chang Mobility Specialist

## 2021-11-20 NOTE — Progress Notes (Signed)
PROGRESS NOTE Alexis Lin  ZOX:[id number] DOB: 1961-03-04 DOA: 11/18/2021 PCP: Vida Rigger, MD   Brief Narrative/Hospital Course: 61 y.o.f w/ history of panic attacks and no other significant medical problem who drinks 1 to 2 glasses of wine every day presents with 1 day history of onset and gradual worsening of epigastric abd pain, radiating to back w/ associated nausea but no vomiting.  Pain worse with movement and palpation.  No suspicious food intake.  No recent fever. no h/o gallbladder issues, appendicitis in 2019.  In the ED CT showed peripancreatic inflammatory changes/fluid inferiorly along the pancreatic head suggesting acute pancreatitis no ductal dilatation no drainable collection/or necrosis> ultrasound with gallbladder sludge no gallstone CBD normal 2 mm.  She had been afebrile, labs with lipase 1141, hypokalemia 3.4, leukocytosis 17k.  She is being admitted with aggressive IV fluid hydration, pain control, antiemetics.      Subjective: Seen and examined.  Reports her abdomen is distended/bloated some nausea but no vomiting today. Would like to drink some sips of water Complains of ongoing pain  Assessment and Plan: Principal Problem:   Acute pancreatitis Active Problems:   Panic attacks   Pancreatitis   Alcohol use   Leukocytosis   Hypokalemia   Acute pancreatitis: Lipase 1141 on admit now at 89, CT showed acute pancreatitis no fluid collection or necrosis.  Gallbladder sludge no cholecystitis and normal CBD.  Etiology likely alcohol use drinking 1 to 2 glasses of wine every day-check ANA/IgG4 for autoimmune pancreatitis.TG level normal 33.  Continue alcohol cessation, aggressive IV fluid hydration pain control IVF antiemetics, allow sips/ice chips, will consult GI, she is known to Dr. Ewing Schlein   Dehydration/ketones in urine continue IV hydration Panic attacks: Moderate stable.  On Neurontin and Lexapro at home Alcohol use: Cessation counseling Leukocytosis: POA likely due to  pancreatitis,, but no fever no fluid collection in the CT or ultrasound uptrending.  Monitor Hypokalemia: Change IVF to potassium containing fluid.  Monitor labs  DVT prophylaxis: enoxaparin (LOVENOX) injection 40 mg Start: 11/19/21 1000 Code Status:   Code Status: Full Code Family Communication: plan of care discussed with patient at bedside. Patient status is: Inpatient because of management of acute pancreatitis Level of care: Med-Surg   Dispo: The patient is from: Home            Anticipated disposition: Unknown likely more than 3 days  Mobility Assessment (last 72 hours)     Mobility Assessment     Row Name 11/20/21 0746 11/19/21 0746 11/19/21 0208       Does patient have an order for bedrest or is patient medically unstable -- No - Continue assessment No - Continue assessment     What is the highest level of mobility based on the progressive mobility assessment? Level 6 (Walks independently in room and hall) - Balance while walking in room without assist - Complete Level 6 (Walks independently in room and hall) - Balance while walking in room without assist - Complete Level 6 (Walks independently in room and hall) - Balance while walking in room without assist - Complete               Objective: Vitals last 24 hrs: Vitals:   11/19/21 1350 11/19/21 1727 11/19/21 2213 11/20/21 0507  BP: 104/62 114/73 114/68 105/61  Pulse: 85 96 (!) 103 99  Resp: 14 16 18 18   Temp: (!) 97.4 F (36.3 C) 98 F (36.7 C) 97.9 F (36.6 C) 98 F (36.7 C)  TempSrc:  Oral Oral Oral Oral  SpO2: 96% 94% 94% 95%  Weight:      Height:       Weight change:   Physical Examination: General exam: alert awake,older than stated age, weak appearing. HEENT:Oral mucosa moist, Ear/Nose WNL grossly, dentition normal. Respiratory system: bilaterally diminished BS, no use of accessory muscle Cardiovascular system: S1 & S2 +, No JVD. Gastrointestinal system: Abdomen soft, bloated, diffuse tenderness  mostly on the mid  to upper abdomen, BS+ Nervous System:Alert, awake, moving extremities and grossly nonfocal Extremities: LE edema neg,distal peripheral pulses palpable.  Skin: No rashes,no icterus. MSK: Normal muscle bulk,tone, power  Medications reviewed:  Scheduled Meds:  enoxaparin (LOVENOX) injection  40 mg Subcutaneous Q24H   pantoprazole (PROTONIX) IV  40 mg Intravenous Daily   Continuous Infusions:  dextrose 5 % and 0.9% NaCl 1,000 mL with potassium chloride 10 mEq infusion 125 mL/hr at 11/20/21 1130   promethazine (PHENERGAN) injection (IM or IVPB) Stopped (11/19/21 1131)      Diet Order             Diet clear liquid Room service appropriate? Yes; Fluid consistency: Thin  Diet effective now                            Intake/Output Summary (Last 24 hours) at 11/20/2021 1153 Last data filed at 11/20/2021 0200 Gross per 24 hour  Intake 2266.95 ml  Output --  Net 2266.95 ml   Net IO Since Admission: 4,989.91 mL [11/20/21 1153]  Wt Readings from Last 3 Encounters:  11/18/21 50.3 kg  11/28/17 44.9 kg  09/27/11 44.9 kg     Unresulted Labs (From admission, onward)     Start     Ordered   11/20/21 1105  ANA  Once,   R        11/20/21 1104   11/20/21 1105  IgG 4  Once,   R        11/20/21 1104          Data Reviewed: I have personally reviewed following labs and imaging studies CBC: Recent Labs  Lab 11/18/21 2102 11/19/21 0334 11/20/21 0326  WBC 17.0* 17.1* 20.7*  NEUTROABS  --   --  18.6*  HGB 14.0 12.8 12.1  HCT 41.5 38.9 37.9  MCV 90.8 94.2 96.4  PLT 173 151 123456*   Basic Metabolic Panel: Recent Labs  Lab 11/18/21 2102 11/19/21 0334 11/20/21 0326  NA 138 141 141  K 3.4* 3.4* 3.5  CL 99 109 113*  CO2 26 25 21*  GLUCOSE 141* 151* 88  BUN 12 11 8   CREATININE 0.79 0.63 0.60  CALCIUM 9.6 7.9* 7.0*  MG  --   --  2.1   GFR: Estimated Creatinine Clearance: 55.7 mL/min (by C-G formula based on SCr of 0.6 mg/dL). Liver Function  Tests: Recent Labs  Lab 11/18/21 2102 11/19/21 0334  AST 15 16  ALT 13 15  ALKPHOS 24* 22*  BILITOT 1.0 0.8  PROT 7.1 6.0*  ALBUMIN 4.7 3.5   Recent Labs  Lab 11/18/21 2102 11/20/21 0326  LIPASE 1,141* 85*   No results for input(s): "AMMONIA" in the last 168 hours. Coagulation Profile: No results for input(s): "INR", "PROTIME" in the last 168 hours. BNP (last 3 results) No results for input(s): "PROBNP" in the last 8760 hours. HbA1C: No results for input(s): "HGBA1C" in the last 72 hours. CBG: No results for input(s): "GLUCAP" in the last  168 hours. Lipid Profile: Recent Labs    11/19/21 0334  TRIG 33   Thyroid Function Tests: No results for input(s): "TSH", "T4TOTAL", "FREET4", "T3FREE", "THYROIDAB" in the last 72 hours. Sepsis Labs: No results for input(s): "PROCALCITON", "LATICACIDVEN" in the last 168 hours.  No results found for this or any previous visit (from the past 240 hour(s)).  Antimicrobials: Anti-infectives (From admission, onward)    None      Culture/Microbiology No results found for: "SDES", "SPECREQUEST", "CULT", "REPTSTATUS"  Other culture-see note  Radiology Studies: US Abdomen Limited  Result Date: 11/19/2021 CLINICAL DATA:  Pancreatitis.  Evaluate for gallstones. EXAM: ULTRASOUND ABDOMEN LIMITED RIGHT UPPER QUADRANT COMPARISON:  CT abdomen and pelvis 11/18/2021 FINDINGS: Gallbladder: There is echogenic nonshadowing sludge within the gallbladder. No gallstones no pericholecystic fluid, gallbladder wall thickening, or sonographic Murphy's sign. Common bile duct: Diameter: 2.0 mm within normal limits. No intrahepatic or extrahepatic biliary ductal dilatation. Liver: No focal lesion identified. Within normal limits in parenchymal echogenicity. Portal vein is patent on color Doppler imaging with normal direction of blood flow towards the liver. Other: Minimal ascites. IMPRESSION: Gallbladder sludge but no gallstones. No sonographic evidence of acute  cholecystitis. Electronically Signed   By: Neita Garnet M.D.   On: 11/19/2021 11:30   CT ABDOMEN PELVIS W CONTRAST  Result Date: 11/18/2021 CLINICAL DATA:  Abdominal pain, pancreatitis EXAM: CT ABDOMEN AND PELVIS WITH CONTRAST TECHNIQUE: Multidetector CT imaging of the abdomen and pelvis was performed using the standard protocol following bolus administration of intravenous contrast. RADIATION DOSE REDUCTION: This exam was performed according to the departmental dose-optimization program which includes automated exposure control, adjustment of the mA and/or kV according to patient size and/or use of iterative reconstruction technique. CONTRAST:  4mL OMNIPAQUE IOHEXOL 300 MG/ML  SOLN COMPARISON:  None Available. FINDINGS: Lower chest: Lung bases are clear. Hepatobiliary: Liver is within normal limits. Gallbladder is unremarkable. No intrahepatic or extrahepatic ductal dilatation. Pancreas: Peripancreatic inflammatory changes/fluid inferiorly along the pancreatic head and anterior aspect of the uncinate process, suggesting acute pancreatitis. No pancreatic ductal dilatation. No drainable fluid collection/walled-off necrosis. Spleen: Within normal limits. Adrenals/Urinary Tract: Adrenal glands are within normal limits. Kidneys are within normal limits.  No hydronephrosis. Bladder is underdistended but unremarkable. Stomach/Bowel: Stomach is within normal limits. Inflammatory changes along the 2nd and 3rd portion of the duodenum (series 2/image 33), suggesting duodenitis. Mildly prominent loops of small bowel in the left mid abdomen (series 2/image 38) likely reflect adynamic ileus. Status post appendectomy. Secondary inflammatory changes involving the hepatic flexure of the colon (series 2/image 36). Vascular/Lymphatic: No evidence of abdominal aortic aneurysm. No suspicious abdominopelvic lymphadenopathy. Reproductive: Status post hysterectomy. Left ovary is within normal limits.  No right adnexal mass. Other:  Small volume pelvic ascites. Musculoskeletal: Status post PLIF at L5-S1 with grade 2 spondylolisthesis. Mild degenerative changes at L4-5. IMPRESSION: Acute pancreatitis. No drainable fluid collection/walled-off necrosis. Secondary inflammatory changes involving the duodenum and hepatic flexure of the colon. Electronically Signed   By: Charline Bills M.D.   On: 11/18/2021 23:49     LOS: 1 day   Lanae Boast, MD [facility]  11/20/2021, 11:53 AM

## 2021-11-20 NOTE — Hospital Course (Addendum)
61 y.o.f w/ history of panic attacks and no other significant medical problem who drinks 1 to 2 glasses of wine every day presents with 1 day history of onset and gradual worsening of epigastric abd pain, radiating to back w/ associated nausea but no vomiting.  Pain worse with movement and palpation.  No suspicious food intake.  No recent fever. no h/o gallbladder issues, appendicitis in 2019.  In the ED CT showed peripancreatic inflammatory changes/fluid inferiorly along the pancreatic head suggesting acute pancreatitis no ductal dilatation no drainable collection/or necrosis> ultrasound with gallbladder sludge no gallstone CBD normal 2 mm.  She had been afebrile, labs with lipase 1141, hypokalemia 3.4, leukocytosis 17k.  She is being admitted with aggressive IV fluid hydration, pain control, antiemetics.

## 2021-11-21 DIAGNOSIS — K8500 Idiopathic acute pancreatitis without necrosis or infection: Secondary | ICD-10-CM | POA: Diagnosis not present

## 2021-11-21 LAB — BASIC METABOLIC PANEL
Anion gap: 6 (ref 5–15)
BUN: 8 mg/dL (ref 8–23)
CO2: 21 mmol/L — ABNORMAL LOW (ref 22–32)
Calcium: 6.8 mg/dL — ABNORMAL LOW (ref 8.9–10.3)
Chloride: 115 mmol/L — ABNORMAL HIGH (ref 98–111)
Creatinine, Ser: 0.51 mg/dL (ref 0.44–1.00)
GFR, Estimated: >60 mL/min (ref >60)
Glucose, Bld: 157 mg/dL — ABNORMAL HIGH (ref 70–99)
Potassium: 3 mmol/L — ABNORMAL LOW (ref 3.5–5.1)
Sodium: 142 mmol/L (ref 135–145)

## 2021-11-21 LAB — CBC
HCT: 32.2 % — ABNORMAL LOW (ref 36.0–46.0)
Hemoglobin: 10.3 g/dL — ABNORMAL LOW (ref 12.0–15.0)
MCH: 31.1 pg (ref 26.0–34.0)
MCHC: 32 g/dL (ref 30.0–36.0)
MCV: 97.3 fL (ref 80.0–100.0)
Platelets: 133 10*3/uL — ABNORMAL LOW (ref 150–400)
RBC: 3.31 MIL/uL — ABNORMAL LOW (ref 3.87–5.11)
RDW: 13 % (ref 11.5–15.5)
WBC: 16.2 10*3/uL — ABNORMAL HIGH (ref 4.0–10.5)
nRBC: 0 % (ref 0.0–0.2)

## 2021-11-21 LAB — IGG 4: IgG, Subclass 4: 22 mg/dL (ref 2–96)

## 2021-11-21 LAB — ANA: Anti Nuclear Antibody (ANA): NEGATIVE

## 2021-11-21 LAB — MAGNESIUM: Magnesium: 2.5 mg/dL — ABNORMAL HIGH (ref 1.7–2.4)

## 2021-11-21 MED ORDER — POTASSIUM CHLORIDE 10 MEQ/100ML IV SOLN: 10.0000 meq | INTRAVENOUS | Status: DC

## 2021-11-21 MED ORDER — POTASSIUM CHLORIDE 10 MEQ/100ML IV SOLN
10.0000 meq | INTRAVENOUS | Status: AC
  Administered 2021-11-21 (×2): 10 meq via INTRAVENOUS
  Filled 2021-11-21: qty 100

## 2021-11-21 MED ORDER — POTASSIUM CHLORIDE CRYS ER 20 MEQ PO TBCR
40.0000 meq | EXTENDED_RELEASE_TABLET | Freq: Once | ORAL | Status: AC
Start: 2021-11-21 — End: 2021-11-21
  Administered 2021-11-21: 40 meq via ORAL
  Filled 2021-11-21: qty 2

## 2021-11-21 MED ORDER — HYDROMORPHONE HCL 1 MG/ML IJ SOLN
0.2500 mg | INTRAMUSCULAR | Status: DC | PRN
  Administered 2021-11-21 (×2): 0.5 mg via INTRAVENOUS
  Filled 2021-11-21 (×2): qty 0.5

## 2021-11-21 NOTE — Progress Notes (Signed)
   11/21/21 1500  Mobility  Activity Ambulated with assistance in hallway  Range of Motion/Exercises Active  Level of Assistance Minimal assist, patient does 75% or more  Assistive Device Front wheel walker  Distance Ambulated (ft) 700 ft  Activity Response Tolerated well  Transport method Ambulatory  $Mobility charge 1 Mobility    Due to feeling nauseous in the morning, completed mobility in the afternoon. Pt agreed to ambulate. Pt mentioned feeling tired during ambulation. Pt ambulated 753ft. At end of session Pt left with necessities in reach.    [facility]

## 2021-11-21 NOTE — Progress Notes (Addendum)
[facility] Gastroenterology Progress Note  Alexis Lin 61 y.o. June 22, 1960   Subjective: Seen and examined lying in bed.  Pain is improved slightly from yesterday.  Continues to have headache, tolerating small amounts of clear liquid diet at this time.  Was able to finish 1 full cup of water this morning.  Notes abdominal pain is not worse after drinking.  No bowel movement since admission.  ROS : Review of Systems  Gastrointestinal:  Positive for abdominal pain. Negative for blood in stool, constipation, diarrhea, heartburn, melena, nausea and vomiting.  Genitourinary:  Negative for dysuria and urgency.      Objective: Vital signs in last 24 hours: Vitals:   11/20/21 1307 11/20/21 2105  BP: 115/72 121/76  Pulse: 94 (!) 107  Resp: 18 17  Temp: 97.7 F (36.5 C)   SpO2: 94% 97%    Physical Exam:  General:  Alert, cooperative, no distress, appears stated age  Head:  Normocephalic, without obvious abnormality, atraumatic  Eyes:  Anicteric sclera, EOM's intact  Lungs:   Clear to auscultation bilaterally, respirations unlabored  Heart:  Regular rate and rhythm, S1, S2 normal  Abdomen:   Soft, epigastric tenderness, improved from yesterday, bowel sounds active all four quadrants,  no masses,   Extremities: Extremities normal, atraumatic, no  edema  Pulses: 2+ and symmetric    Lab Results: Recent Labs    11/20/21 0326 11/21/21 0324  NA 141 142  K 3.5 3.0*  CL 113* 115*  CO2 21* 21*  GLUCOSE 88 157*  BUN 8 8  CREATININE 0.60 0.51  CALCIUM 7.0* 6.8*  MG 2.1 2.5*   Recent Labs    11/18/21 2102 11/19/21 0334  AST 15 16  ALT 13 15  ALKPHOS 24* 22*  BILITOT 1.0 0.8  PROT 7.1 6.0*  ALBUMIN 4.7 3.5   Recent Labs    11/20/21 0326 11/21/21 0324  WBC 20.7* 16.2*  NEUTROABS 18.6*  --   HGB 12.1 10.3*  HCT 37.9 32.2*  MCV 96.4 97.3  PLT 149* 133*   No results for input(s): "LABPROT", "INR" in the last 72 hours.    Assessment Acute pancreatitis   Lipase  11/18/2021 1141 Hgb 10.3(12.1) Normal hepatic panel Triglycerides 33   Right upper quadrant ultrasound 11/19/2021 Gallbladder sludge but no gallstones. No sonographic evidence of acute cholecystitis   CT abdomen pelvis with contrast 11/18/2021 Acute pancreatitis. No drainable fluid collection/walled-off necrosis. Secondary inflammatory changes involving the duodenum and hepatic flexure of the colon.   Patient with history of alcohol use 1-2 times per week.  Evidence of cholelithiasis.  Possible pancreatitis due to gallbladder sludge noted on right upper quadrant ultrasound.  No elevation of triglycerides. We will evaluate for autoimmune pancreatitis.  Negative ANA, awaiting IgG4.   Plan: Continue clear liquid diet.  If patient continues to tolerate clear liquids may consider full liquid diet this evening or tomorrow. Continue pain control as needed Consider MRCP to rule out pancreatic divisum as outpatient Can consider endoscopy later this week or as outpatient for further evaluation gastric ulcer per Dr. Marlane Hatcher recommendation.  Roseanne Reno Agron Swiney PA-C 11/21/2021, 9:42 AM  Contact #  [phone]

## 2021-11-21 NOTE — Progress Notes (Signed)
GI social visit patient in good spirits walking some feeling better less pain tolerating a little clear liquids moved her bowels a little some increased abdominal girth and again Case discussed with her husband and we answered all of their questions and discussed low-fat diet at home and possibly even some pancreatic enzymes if needed and hopefully tomorrow if she continues to improve we can slowly advance her diet

## 2021-11-21 NOTE — Progress Notes (Signed)
PROGRESS NOTE Alexis Lin  HUT:[id number] DOB: 23-Oct-1960 DOA: 11/18/2021 PCP: Vida Rigger, MD   Brief Narrative/Hospital Course: 61 y.o.f w/ history of panic attacks and no other significant medical problem who drinks 1 to 2 glasses of wine every day presents with 1 day history of onset and gradual worsening of epigastric abd pain, radiating to back w/ associated nausea but no vomiting.  Pain worse with movement and palpation.  No suspicious food intake.  No recent fever. no h/o gallbladder issues, appendicitis in 2019.  In the ED CT showed peripancreatic inflammatory changes/fluid inferiorly along the pancreatic head suggesting acute pancreatitis no ductal dilatation no drainable collection/or necrosis> ultrasound with gallbladder sludge no gallstone CBD normal 2 mm.  She had been afebrile, labs with lipase 1141, hypokalemia 3.4, leukocytosis 17k.  She is being admitted with aggressive IV fluid hydration, pain control, antiemetics.      Subjective: Seen and examined this morning bit drowsy, pain is controlled with IV Dilaudid. Abdomen feels less distended less painful this morning she states  Overnight potassium was low again, WBC downtrending, has been receiving Dilaudid iv for pain On clear liquid diet.  Assessment and Plan: Principal Problem:   Acute pancreatitis Active Problems:   Panic attacks   Pancreatitis   Alcohol use   Leukocytosis   Hypokalemia   Acute pancreatitis: Lipase 1141 on admit now at 89, CT showed acute pancreatitis no fluid collection or necrosis. US-Gallbladder sludge no cholecystitis and normal CBD.  Etiology unclear, patient reports he was not taking alcohol a week prior to the episode, GI has been consulted continue hepatitis work-up in process, planning MiraLAX, possible MRCP, might need to consider endoscopy later in the week as outpatient.For now continue IV fluids, conservative management pain control-cut down dose from 0.5-1 to 0.25-0.5 mg due to being bit  drowsy, cont  antiemetics.  Diet as per GI. TG level normal 33.  Continue alcohol cessation.  Dehydration/ketones in urine continue IV hydration Panic attacks: Mood is stable.  On Neurontin and Lexapro at home Alcohol use: mild use- cessation counseling Leukocytosis: POA likely due to pancreatitis, downtrending,no fever or fluid collection in the CT or ultrasound Hypokalemia: Repleting again.  Recheck   DVT prophylaxis: enoxaparin (LOVENOX) injection 40 mg Start: 11/19/21 1000 Code Status:   Code Status: Full Code Family Communication: plan of care discussed with patient at bedside. Patient status is: Inpatient because of management of acute pancreatitis  Level of care: Med-Surg  Dispo: The patient is from: Home            Anticipated disposition: Unknown likely more than 3 days  Mobility Assessment (last 72 hours)     Mobility Assessment     Row Name 11/20/21 0746 11/19/21 0746 11/19/21 0208       Does patient have an order for bedrest or is patient medically unstable -- No - Continue assessment No - Continue assessment     What is the highest level of mobility based on the progressive mobility assessment? Level 6 (Walks independently in room and hall) - Balance while walking in room without assist - Complete Level 6 (Walks independently in room and hall) - Balance while walking in room without assist - Complete Level 6 (Walks independently in room and hall) - Balance while walking in room without assist - Complete               Objective: Vitals last 24 hrs: Vitals:   11/19/21 2213 11/20/21 0507 11/20/21 1307 11/20/21 2105  BP: 114/68 105/61 115/72 121/76  Pulse: (!) 103 99 94 (!) 107  Resp: 18 18 18 17   Temp: 97.9 F (36.6 C) 98 F (36.7 C) 97.7 F (36.5 C)   TempSrc: Oral Oral Oral   SpO2: 94% 95% 94% 97%  Weight:      Height:       Weight change:   Physical Examination: General exam: AAOX3, older than stated age, weak appearing. HEENT:Oral mucosa moist,  Ear/Nose WNL grossly, dentition normal. Respiratory system: b/l clear,no use of accessory muscle Cardiovascular system: S1 & S2 +, No JVD. Gastrointestinal system: Abdomen soft, distended tender in the mid upper epigastrium,BS+. Nervous System:Alert, awake, moving extremities and grossly nonfocal Extremities: LE ankle edema NEG, distal peripheral pulses palpable.  Skin: No rashes,no icterus. MSK: Normal muscle bulk,tone, power   Medications reviewed:  Scheduled Meds:  cycloSPORINE  1 drop Both Eyes BID   enoxaparin (LOVENOX) injection  40 mg Subcutaneous Q24H   escitalopram  5 mg Oral Daily   gabapentin  600 mg Oral QHS   pantoprazole (PROTONIX) IV  40 mg Intravenous Daily   polyethylene glycol  17 g Oral Daily   Continuous Infusions:  dextrose 5 % and 0.9% NaCl 1,000 mL with potassium chloride 10 mEq infusion 125 mL/hr at 11/21/21 0553   potassium chloride 10 mEq (11/21/21 0944)   promethazine (PHENERGAN) injection (IM or IVPB) Stopped (11/19/21 1131)      Diet Order             Diet clear liquid Room service appropriate? Yes; Fluid consistency: Thin  Diet effective now                  Intake/Output Summary (Last 24 hours) at 11/21/2021 1016 Last data filed at 11/21/2021 0600 Gross per 24 hour  Intake 1360 ml  Output --  Net 1360 ml   Net IO Since Admission: 6,969.91 mL [11/21/21 1016]  Wt Readings from Last 3 Encounters:  11/18/21 50.3 kg  11/28/17 44.9 kg  09/27/11 44.9 kg     Unresulted Labs (From admission, onward)     Start     Ordered   11/21/21 0500  Basic metabolic panel  Daily at 5am,   R      11/20/21 1155   11/21/21 0500  CBC  Daily at 5am,   R      11/20/21 1155   11/20/21 1105  IgG 4  Once,   R        11/20/21 1104          Data Reviewed: I have personally reviewed following labs and imaging studies CBC: Recent Labs  Lab 11/18/21 2102 11/19/21 0334 11/20/21 0326 11/21/21 0324  WBC 17.0* 17.1* 20.7* 16.2*  NEUTROABS  --   --  18.6*   --   HGB 14.0 12.8 12.1 10.3*  HCT 41.5 38.9 37.9 32.2*  MCV 90.8 94.2 96.4 97.3  PLT 173 151 149* 133*   Basic Metabolic Panel: Recent Labs  Lab 11/18/21 2102 11/19/21 0334 11/20/21 0326 11/21/21 0324  NA 138 141 141 142  K 3.4* 3.4* 3.5 3.0*  CL 99 109 113* 115*  CO2 26 25 21* 21*  GLUCOSE 141* 151* 88 157*  BUN 12 11 8 8   CREATININE 0.79 0.63 0.60 0.51  CALCIUM 9.6 7.9* 7.0* 6.8*  MG  --   --  2.1 2.5*   GFR: Estimated Creatinine Clearance: 55.7 mL/min (by C-G formula based on SCr of 0.51 mg/dL). Liver  Function Tests: Recent Labs  Lab 11/18/21 2102 11/19/21 0334  AST 15 16  ALT 13 15  ALKPHOS 24* 22*  BILITOT 1.0 0.8  PROT 7.1 6.0*  ALBUMIN 4.7 3.5   Recent Labs  Lab 11/18/21 2102 11/20/21 0326  LIPASE 1,141* 85*   No results for input(s): "AMMONIA" in the last 168 hours. Coagulation Profile: No results for input(s): "INR", "PROTIME" in the last 168 hours. BNP (last 3 results) No results for input(s): "PROBNP" in the last 8760 hours. HbA1C: No results for input(s): "HGBA1C" in the last 72 hours. CBG: No results for input(s): "GLUCAP" in the last 168 hours. Lipid Profile: Recent Labs    11/19/21 0334  TRIG 33   Thyroid Function Tests: No results for input(s): "TSH", "T4TOTAL", "FREET4", "T3FREE", "THYROIDAB" in the last 72 hours. Sepsis Labs: No results for input(s): "PROCALCITON", "LATICACIDVEN" in the last 168 hours.  No results found for this or any previous visit (from the past 240 hour(s)).  Antimicrobials: Anti-infectives (From admission, onward)    None      Culture/Microbiology No results found for: "SDES", "SPECREQUEST", "CULT", "REPTSTATUS"  Other culture-see note  Radiology Studies: US Abdomen Limited  Result Date: 11/19/2021 CLINICAL DATA:  Pancreatitis.  Evaluate for gallstones. EXAM: ULTRASOUND ABDOMEN LIMITED RIGHT UPPER QUADRANT COMPARISON:  CT abdomen and pelvis 11/18/2021 FINDINGS: Gallbladder: There is echogenic  nonshadowing sludge within the gallbladder. No gallstones no pericholecystic fluid, gallbladder wall thickening, or sonographic Murphy's sign. Common bile duct: Diameter: 2.0 mm within normal limits. No intrahepatic or extrahepatic biliary ductal dilatation. Liver: No focal lesion identified. Within normal limits in parenchymal echogenicity. Portal vein is patent on color Doppler imaging with normal direction of blood flow towards the liver. Other: Minimal ascites. IMPRESSION: Gallbladder sludge but no gallstones. No sonographic evidence of acute cholecystitis. Electronically Signed   By: Neita Garnet M.D.   On: 11/19/2021 11:30     LOS: 2 days   Lanae Boast, MD [facility]  11/21/2021, 10:16 AM

## 2021-11-22 DIAGNOSIS — K8500 Idiopathic acute pancreatitis without necrosis or infection: Secondary | ICD-10-CM | POA: Diagnosis not present

## 2021-11-22 LAB — CBC
HCT: 32 % — ABNORMAL LOW (ref 36.0–46.0)
Hemoglobin: 10.3 g/dL — ABNORMAL LOW (ref 12.0–15.0)
MCH: 30.7 pg (ref 26.0–34.0)
MCHC: 32.2 g/dL (ref 30.0–36.0)
MCV: 95.2 fL (ref 80.0–100.0)
Platelets: 140 10*3/uL — ABNORMAL LOW (ref 150–400)
RBC: 3.36 MIL/uL — ABNORMAL LOW (ref 3.87–5.11)
RDW: 12.8 % (ref 11.5–15.5)
WBC: 9.6 10*3/uL (ref 4.0–10.5)
nRBC: 0 % (ref 0.0–0.2)

## 2021-11-22 LAB — BASIC METABOLIC PANEL
Anion gap: 6 (ref 5–15)
BUN: <5 mg/dL — ABNORMAL LOW (ref 8–23)
CO2: 23 mmol/L (ref 22–32)
Calcium: 7.5 mg/dL — ABNORMAL LOW (ref 8.9–10.3)
Chloride: 114 mmol/L — ABNORMAL HIGH (ref 98–111)
Creatinine, Ser: 0.55 mg/dL (ref 0.44–1.00)
GFR, Estimated: >60 mL/min (ref >60)
Glucose, Bld: 126 mg/dL — ABNORMAL HIGH (ref 70–99)
Potassium: 3.2 mmol/L — ABNORMAL LOW (ref 3.5–5.1)
Sodium: 143 mmol/L (ref 135–145)

## 2021-11-22 MED ORDER — POTASSIUM CHLORIDE CRYS ER 20 MEQ PO TBCR
20.0000 meq | EXTENDED_RELEASE_TABLET | Freq: Once | ORAL | Status: AC
  Administered 2021-11-22: 20 meq via ORAL
  Filled 2021-11-22: qty 1

## 2021-11-22 MED ORDER — POTASSIUM CHLORIDE 10 MEQ/100ML IV SOLN
10.0000 meq | INTRAVENOUS | Status: AC
  Administered 2021-11-22 (×2): 10 meq via INTRAVENOUS
  Filled 2021-11-22 (×2): qty 100

## 2021-11-22 MED ORDER — POTASSIUM CHLORIDE CRYS ER 20 MEQ PO TBCR
40.0000 meq | EXTENDED_RELEASE_TABLET | Freq: Once | ORAL | Status: DC
Start: 2021-11-22 — End: 2021-11-22

## 2021-11-22 MED ORDER — ESCITALOPRAM OXALATE 10 MG PO TABS
5.0000 mg | ORAL_TABLET | Freq: Once | ORAL | Status: AC
  Administered 2021-11-22: 5 mg via ORAL
  Filled 2021-11-22: qty 1

## 2021-11-22 MED ORDER — PANTOPRAZOLE SODIUM 40 MG PO TBEC
40.0000 mg | DELAYED_RELEASE_TABLET | Freq: Every day | ORAL | Status: DC
  Administered 2021-11-23: 40 mg via ORAL
  Filled 2021-11-22: qty 1

## 2021-11-22 NOTE — Progress Notes (Signed)
[facility] Gastroenterology Progress Note  Alexis Lin 61 y.o. 1960/09/18   Subjective: Patient seen and examined in bed.  Small bowel movements yesterday, notes pain is improved.  She is tolerating clear liquid diet well.  Patient is willing to try full liquid diet.   ROS : Review of Systems  Gastrointestinal:  Negative for abdominal pain, blood in stool, constipation, diarrhea, heartburn, melena, nausea and vomiting.  Genitourinary:  Negative for dysuria and urgency.     Objective: Vital signs in last 24 hours: Vitals:   11/22/21 0540 11/22/21 0948  BP: (!) 142/84 137/83  Pulse: 91 94  Resp: 17 18  Temp:  98.5 F (36.9 C)  SpO2: 97% 97%    Physical Exam:  General:  Alert, cooperative, no distress, appears stated age  Head:  Normocephalic, without obvious abnormality, atraumatic  Eyes:  Anicteric sclera, EOM's intact  Lungs:   Clear to auscultation bilaterally, respirations unlabored  Heart:  Regular rate and rhythm, S1, S2 normal  Abdomen:   Tender to epigastric region with guarding, abdomen distended, bowel sounds active all four quadrants,  no masses,   Extremities: Extremities normal, atraumatic, no  edema  Pulses: 2+ and symmetric    Lab Results: Recent Labs    11/20/21 0326 11/21/21 0324 11/22/21 0340  NA 141 142 143  K 3.5 3.0* 3.2*  CL 113* 115* 114*  CO2 21* 21* 23  GLUCOSE 88 157* 126*  BUN 8 8 <5*  CREATININE 0.60 0.51 0.55  CALCIUM 7.0* 6.8* 7.5*  MG 2.1 2.5*  --    No results for input(s): "AST", "ALT", "ALKPHOS", "BILITOT", "PROT", "ALBUMIN" in the last 72 hours. Recent Labs    11/20/21 0326 11/21/21 0324 11/22/21 0340  WBC 20.7* 16.2* 9.6  NEUTROABS 18.6*  --   --   HGB 12.1 10.3* 10.3*  HCT 37.9 32.2* 32.0*  MCV 96.4 97.3 95.2  PLT 149* 133* 140*   No results for input(s): "LABPROT", "INR" in the last 72 hours.    Assessment Acute pancreatitis  Lipase 11/18/2021 1141 Hgb 10.3 Normal hepatic panel Triglycerides 33   Right upper  quadrant ultrasound 11/19/2021 Gallbladder sludge but no gallstones. No sonographic evidence of acute cholecystitis   CT abdomen pelvis with contrast 11/18/2021 Acute pancreatitis. No drainable fluid collection/walled-off necrosis. Secondary inflammatory changes involving the duodenum and hepatic flexure of the colon.   Patient with history of alcohol use 1-2 times per week.  Evidence of cholelithiasis.  Possible pancreatitis due to gallbladder sludge noted on right upper quadrant ultrasound.  No elevation of triglycerides. We will evaluate for autoimmune pancreatitis.   Negative ANA, normal IgG4 unlikely autoimmune pancreatitis.  Patient reports abdominal pain improving but continues to be tender on exam.    Plan: Advance diet slowly starting with full liquids, if pain worsens or is not tolerated return to clear liquid diet. At home patient will need low-fat diet.  Plan for outpatient MRCP [facility] will follow  Roseanne Reno Danijah Noh PA-C 11/22/2021, 10:08 AM  Contact #  [phone]

## 2021-11-22 NOTE — Progress Notes (Signed)

## 2021-11-22 NOTE — Progress Notes (Signed)
PROGRESS NOTE Alexis Lin  URK:[id number] DOB: 04/19/1961 DOA: 11/18/2021 PCP: Vida Rigger, MD   Brief Narrative/Hospital Course: 61 y.o.f w/ history of panic attacks and no other significant medical problem who drinks 1 to 2 glasses of wine every day presents with 1 day history of onset and gradual worsening of epigastric abd pain, radiating to back w/ associated nausea but no vomiting.  Pain worse with movement and palpation.  No suspicious food intake.  No recent fever. no h/o gallbladder issues, appendicitis in 2019.  In the ED CT showed peripancreatic inflammatory changes/fluid inferiorly along the pancreatic head suggesting acute pancreatitis no ductal dilatation no drainable collection/or necrosis> ultrasound with gallbladder sludge no gallstone CBD normal 2 mm.  She had been afebrile, labs with lipase 1141, hypokalemia 3.4, leukocytosis 17k.  She is being admitted with aggressive IV fluid hydration, pain control, antiemetics.      Subjective: Seen and examined this morning, reports her pain is improving, abdomen still bloated Overnight afebrile, potassium still low and being replaced, leukocytosis has resolved Small bowel movements yesterday, tolerating clears  Assessment and Plan: Principal Problem:   Acute pancreatitis Active Problems:   Panic attacks   Pancreatitis   Alcohol use   Leukocytosis   Hypokalemia   Acute pancreatitis: Lipase 1141 on admit now at 89, CT showed acute pancreatitis no fluid collection or necrosis. US-Gallbladder sludge no cholecystitis and normal CBD.  Etiology unclear, patient reports she was not taking alcohol a week prior to the episode, GI has been consulted appreciate input, currently improved with current conservative management, continue laxatives as needed, IV fluids, pain control, advancing to full liquid diet as per GI.  Plan for outpatient MRCP.TG level normal 33.  Discussed alcohol cessation.  Dehydration/ketones in urine: Hydration status   improving, weaning down IV fluids as tolerating p.o. Panic attacks: Mood is stable. cont Neurontin and Lexapro at home Alcohol use: mild use: cessation counseling Leukocytosis: POA likely due to pancreatitis, resolved,no fever or fluid collection in the CT or ultrasound Hypokalemia: Repleting again po + iv- and recheck  Recent Labs  Lab 11/18/21 2102 11/19/21 0334 11/20/21 0326 11/21/21 0324 11/22/21 0340  K 3.4* 3.4* 3.5 3.0* 3.2*     DVT prophylaxis: enoxaparin (LOVENOX) injection 40 mg Start: 11/19/21 1000 Code Status:   Code Status: Full Code Family Communication: plan of care discussed with patient at bedside. Patient status is: Inpatient because of management of acute pancreatitis  Level of care: Med-Surg  Dispo: The patient is from: Home            Anticipated disposition: Unknown likely 2 days  Mobility Assessment (last 72 hours)     Mobility Assessment     Row Name 11/21/21 0809 11/20/21 0746         What is the highest level of mobility based on the progressive mobility assessment? Level 6 (Walks independently in room and hall) - Balance while walking in room without assist - Complete Level 6 (Walks independently in room and hall) - Balance while walking in room without assist - Complete                Objective: Vitals last 24 hrs: Vitals:   11/21/21 1715 11/21/21 2108 11/22/21 0540 11/22/21 0948  BP: 133/87 136/84 (!) 142/84 137/83  Pulse: 95 99 91 94  Resp: 17 17 17 18   Temp: 98.4 F (36.9 C) 99.5 F (37.5 C)  98.5 F (36.9 C)  TempSrc: Oral Oral  Oral  SpO2: 97%  97% 97% 97%  Weight:      Height:       Weight change:   Physical Examination: General exam: AA OX3, older than stated age, weak appearing. HEENT:Oral mucosa moist, Ear/Nose WNL grossly, dentition normal. Respiratory system: bilaterally diminished, no use of accessory muscle Cardiovascular system: S1 & S2 +, No JVD,. Gastrointestinal system: Abdomen soft, tender on upper abdomen ,  mildly distended ,BS+ Nervous System:Alert, awake, moving extremities and grossly nonfocal Extremities: LE ankle edema negative, distal peripheral pulses palpable.  Skin: No rashes,no icterus. MSK: Normal muscle bulk,tone, power   Medications reviewed:  Scheduled Meds:  cycloSPORINE  1 drop Both Eyes BID   enoxaparin (LOVENOX) injection  40 mg Subcutaneous Q24H   escitalopram  5 mg Oral Daily   gabapentin  600 mg Oral QHS   pantoprazole (PROTONIX) IV  40 mg Intravenous Daily   polyethylene glycol  17 g Oral Daily   Continuous Infusions:  dextrose 5 % and 0.9% NaCl 1,000 mL with potassium chloride 10 mEq infusion 125 mL/hr at 11/22/21 0557   potassium chloride 10 mEq (11/22/21 1039)   promethazine (PHENERGAN) injection (IM or IVPB) Stopped (11/19/21 1131)      Diet Order             Diet full liquid Room service appropriate? Yes; Fluid consistency: Thin  Diet effective now                  Intake/Output Summary (Last 24 hours) at 11/22/2021 1103 Last data filed at 11/22/2021 0600 Gross per 24 hour  Intake 1860 ml  Output --  Net 1860 ml    Net IO Since Admission: 8,829.91 mL [11/22/21 1103]  Wt Readings from Last 3 Encounters:  11/18/21 50.3 kg  11/28/17 44.9 kg  09/27/11 44.9 kg     Unresulted Labs (From admission, onward)     Start     Ordered   11/21/21 0500  Basic metabolic panel  Daily at 5am,   R      11/20/21 1155   11/21/21 0500  CBC  Daily at 5am,   R      11/20/21 1155          Data Reviewed: I have personally reviewed following labs and imaging studies CBC: Recent Labs  Lab 11/18/21 2102 11/19/21 0334 11/20/21 0326 11/21/21 0324 11/22/21 0340  WBC 17.0* 17.1* 20.7* 16.2* 9.6  NEUTROABS  --   --  18.6*  --   --   HGB 14.0 12.8 12.1 10.3* 10.3*  HCT 41.5 38.9 37.9 32.2* 32.0*  MCV 90.8 94.2 96.4 97.3 95.2  PLT 173 151 149* 133* 140*    Basic Metabolic Panel: Recent Labs  Lab 11/18/21 2102 11/19/21 0334 11/20/21 0326  11/21/21 0324 11/22/21 0340  NA 138 141 141 142 143  K 3.4* 3.4* 3.5 3.0* 3.2*  CL 99 109 113* 115* 114*  CO2 26 25 21* 21* 23  GLUCOSE 141* 151* 88 157* 126*  BUN 12 11 8 8  <5*  CREATININE 0.79 0.63 0.60 0.51 0.55  CALCIUM 9.6 7.9* 7.0* 6.8* 7.5*  MG  --   --  2.1 2.5*  --     GFR: Estimated Creatinine Clearance: 55.7 mL/min (by C-G formula based on SCr of 0.55 mg/dL). Liver Function Tests: Recent Labs  Lab 11/18/21 2102 11/19/21 0334  AST 15 16  ALT 13 15  ALKPHOS 24* 22*  BILITOT 1.0 0.8  PROT 7.1 6.0*  ALBUMIN 4.7 3.5  Recent Labs  Lab 11/18/21 2102 11/20/21 0326  LIPASE 1,141* 85*    No results for input(s): "AMMONIA" in the last 168 hours. Coagulation Profile: No results for input(s): "INR", "PROTIME" in the last 168 hours. BNP (last 3 results) No results for input(s): "PROBNP" in the last 8760 hours. HbA1C: No results for input(s): "HGBA1C" in the last 72 hours. CBG: No results for input(s): "GLUCAP" in the last 168 hours. Lipid Profile: No results for input(s): "CHOL", "HDL", "LDLCALC", "TRIG", "CHOLHDL", "LDLDIRECT" in the last 72 hours.  Thyroid Function Tests: No results for input(s): "TSH", "T4TOTAL", "FREET4", "T3FREE", "THYROIDAB" in the last 72 hours. Sepsis Labs: No results for input(s): "PROCALCITON", "LATICACIDVEN" in the last 168 hours.  No results found for this or any previous visit (from the past 240 hour(s)).  Antimicrobials: Anti-infectives (From admission, onward)    None      Culture/Microbiology No results found for: "SDES", "SPECREQUEST", "CULT", "REPTSTATUS"  Other culture-see note  Radiology Studies: No results found.   LOS: 3 days   Lanae Boast, MD [facility]  11/22/2021, 11:03 AM

## 2021-11-22 NOTE — Progress Notes (Signed)
Patient educated on not abruptly discontinuing her Lexapro. She was informed if she feels ready to discontinue this medication she needs to speak with her primary and they will guide her on how to safely discontinue. Patient verbalized understanding. Medication has not been administered in a few days and the patient is starting to feel the side effects of not taking her medication.

## 2021-11-23 ENCOUNTER — Other Ambulatory Visit (HOSPITAL_COMMUNITY): Payer: Self-pay

## 2021-11-23 DIAGNOSIS — K8500 Idiopathic acute pancreatitis without necrosis or infection: Secondary | ICD-10-CM | POA: Diagnosis not present

## 2021-11-23 LAB — CBC
HCT: 29.4 % — ABNORMAL LOW (ref 36.0–46.0)
Hemoglobin: 9.7 g/dL — ABNORMAL LOW (ref 12.0–15.0)
MCH: 30.6 pg (ref 26.0–34.0)
MCHC: 33 g/dL (ref 30.0–36.0)
MCV: 92.7 fL (ref 80.0–100.0)
Platelets: 155 10*3/uL (ref 150–400)
RBC: 3.17 MIL/uL — ABNORMAL LOW (ref 3.87–5.11)
RDW: 12.7 % (ref 11.5–15.5)
WBC: 8.3 10*3/uL (ref 4.0–10.5)
nRBC: 0 % (ref 0.0–0.2)

## 2021-11-23 LAB — BASIC METABOLIC PANEL
Anion gap: 7 (ref 5–15)
BUN: <5 mg/dL — ABNORMAL LOW (ref 8–23)
CO2: 22 mmol/L (ref 22–32)
Calcium: 7.5 mg/dL — ABNORMAL LOW (ref 8.9–10.3)
Chloride: 112 mmol/L — ABNORMAL HIGH (ref 98–111)
Creatinine, Ser: 0.45 mg/dL (ref 0.44–1.00)
GFR, Estimated: >60 mL/min (ref >60)
Glucose, Bld: 123 mg/dL — ABNORMAL HIGH (ref 70–99)
Potassium: 3.4 mmol/L — ABNORMAL LOW (ref 3.5–5.1)
Sodium: 141 mmol/L (ref 135–145)

## 2021-11-23 MED ORDER — POTASSIUM CHLORIDE CRYS ER 20 MEQ PO TBCR
40.0000 meq | EXTENDED_RELEASE_TABLET | Freq: Once | ORAL | Status: AC
  Administered 2021-11-23: 40 meq via ORAL
  Filled 2021-11-23: qty 2

## 2021-11-23 MED ORDER — OMEPRAZOLE 40 MG PO CPDR
40.0000 mg | DELAYED_RELEASE_CAPSULE | Freq: Every day | ORAL | 0 refills | Status: DC
  Filled 2021-11-23: qty 30, 30d supply, fill #0

## 2021-11-23 NOTE — Plan of Care (Signed)
  Problem: Pain Managment: Goal: General experience of comfort will improve 11/23/2021 0901 by Penelope Galas, RN Outcome: Progressing 11/23/2021 0901 by Penelope Galas, RN Outcome: Progressing   Problem: Safety: Goal: Ability to remain free from injury will improve 11/23/2021 0901 by Penelope Galas, RN Outcome: Progressing 11/23/2021 0901 by Penelope Galas, RN Outcome: Progressing

## 2021-11-23 NOTE — Progress Notes (Signed)
Nutrition Education Note  RD consulted for nutrition education regarding a low fat diet to help manage pancreatitis.  Lipid Panel     Component Value Date/Time   TRIG 33 11/19/2021 0334    RD provided "Pancreatitis Nutrition Therapy" handout from the [employer]. Reviewed patient's dietary recall. Provided examples on ways to decrease fat intake in diet. Encourage pt to take a daily MVI. Provided sample menus. Encouraged fresh fruits and vegetables as well as whole grain sources of carbohydrates to maximize fiber intake. Teach back method used.  Expect good compliance.  Body mass index is 20.61 kg/m. Pt meets criteria for normal based on current BMI.  Current diet order is full liquid. Labs and medications reviewed. No further nutrition interventions warranted at this time.  If additional nutrition issues arise, please re-consult RD.  Tilda Franco, MS, RD, LDN Inpatient Clinical Dietitian Contact information available via Amion

## 2021-11-23 NOTE — Progress Notes (Signed)
Discharge instructions reviewed with patient. Verbalizes understanding. 

## 2021-11-23 NOTE — Discharge Summary (Signed)
Physician Discharge Summary  Alexis Lin WFU:[id number] DOB: 1960/05/20 DOA: 11/18/2021  PCP: Vida Rigger, MD  Admit date: 11/18/2021 Discharge date: 11/23/2021 Recommendations for Outpatient Follow-up:  Follow up with PCP and Dr Ewing Schlein in 1 weeks-call for appointment Please obtain BMP/CBC in one week  Discharge Dispo: home Discharge Condition: Stable Code Status:   Code Status: Full Code Diet recommendation:  Diet Order             Diet full liquid Room service appropriate? Yes; Fluid consistency: Thin  Diet effective now                    Brief/Interim Summary: 61 y.o.f w/ history of panic attacks and no other significant medical problem who drinks 1 to 2 glasses of wine every day presents with 1 day history of onset and gradual worsening of epigastric abd pain, radiating to back w/ associated nausea but no vomiting.  Pain worse with movement and palpation.  No suspicious food intake.  No recent fever. no h/o gallbladder issues, appendicitis in 2019.  In the ED CT showed peripancreatic inflammatory changes/fluid inferiorly along the pancreatic head suggesting acute pancreatitis no ductal dilatation no drainable collection/or necrosis> ultrasound with gallbladder sludge no gallstone CBD normal 2 mm.  She had been afebrile, labs with lipase 1141, hypokalemia 3.4, leukocytosis 17k.  She is being admitted with aggressive IV fluid hydration, pain control, antiemetics.  Patient was managed conservatively, at this time she is clinically improved.  Tolerating diet seen by GI this morning okay for discharge home-and she will closely follow-up with Dr. Ewing Schlein      Discharge Diagnoses:  Principal Problem:   Acute pancreatitis Active Problems:   Panic attacks   Pancreatitis   Alcohol use   Leukocytosis   Hypokalemia  Acute pancreatitis: Lipase 1141 on admit now at 89, CT showed acute pancreatitis no fluid collection or necrosis. US-Gallbladder sludge no cholecystitis and normal CBD.   Etiology unclear, patient reports she was not taking alcohol a week prior to the episode, GI has been consulted - Patient was managed conservatively, at this time she is clinically improved.  Tolerating diet seen by GI this morning okay for discharge home-and she will closely follow-up with Dr. Ewing Schlein   Dehydration/ketones in urine: resolved Panic attacks: Mood is stable. cont Neurontin and Lexapro at home Alcohol use: mild use: cessation advised Leukocytosis: POA likely due to pancreatitis, resolved,no fever or fluid collection in the CT or ultrasound Hypokalemia: Repleted  Consults: GI Subjective: Alert awake oriented, resting comfortably tolerating diet well, Gi IN  ROOM-Advised she can go home today  Discharge Exam: Vitals:   11/23/21 0552 11/23/21 0933  BP: (!) 143/89 (!) 151/94  Pulse: 86 84  Resp: 17 17  Temp: 98.2 F (36.8 C) 98.2 F (36.8 C)  SpO2: 98% 97%   General: Pt is alert, awake, not in acute distress Cardiovascular: RRR, S1/S2 +, no rubs, no gallops Respiratory: CTA bilaterally, no wheezing, no rhonchi Abdominal: Soft, NT, ND, bowel sounds + Extremities: no edema, no cyanosis  Discharge Instructions  Discharge Instructions     Discharge instructions   Complete by: As directed    Please continue with low fat diet   Please call call MD or return to ER for similar or worsening recurring problem that brought you to hospital or if any fever,nausea/vomiting,abdominal pain, uncontrolled pain, chest pain,  shortness of breath or any other alarming symptoms.  Please follow-up your doctor as instructed in a week time  and call the office for appointment.  Please avoid alcohol, smoking, or any other illicit substance and maintain healthy habits including taking your regular medications as prescribed.  You were cared for by a hospitalist during your hospital stay. If you have any questions about your discharge medications or the care you received while you were in the  hospital after you are discharged, you can call the unit and ask to speak with the hospitalist on call if the hospitalist that took care of you is not available.  Once you are discharged, your primary care physician will handle any further medical issues. Please note that NO REFILLS for any discharge medications will be authorized once you are discharged, as it is imperative that you return to your primary care physician (or establish a relationship with a primary care physician if you do not have one) for your aftercare needs so that they can reassess your need for medications and monitor your lab values   Increase activity slowly   Complete by: As directed       Allergies as of 11/23/2021   No Known Allergies      Medication List     STOP taking these medications    ibuprofen 200 MG tablet Commonly known as: ADVIL   traMADol 50 MG tablet Commonly known as: ULTRAM       TAKE these medications    CALCIUM PO Take 2 tablets by mouth daily.   escitalopram 10 MG tablet Commonly known as: LEXAPRO Take 5 mg by mouth daily.   gabapentin 300 MG capsule Commonly known as: NEURONTIN Take 600 mg by mouth at bedtime.   meloxicam 15 MG tablet Commonly known as: MOBIC Take 15 mg by mouth daily.   omeprazole 40 MG capsule Commonly known as: PRILOSEC Take 1 capsule by mouth daily. Start taking on: November 24, 2021   One-A-Day Womens VitaCraves [place] 2 tablets by mouth daily.   OVER THE COUNTER MEDICATION Take 1,000 mg by mouth daily. Tumeric Liquid supplement   polyethylene glycol 17 g packet Commonly known as: MIRALAX / GLYCOLAX Take 8.5 g by mouth daily.   [employer] Purelax 17 GM/SCOOP powder Generic drug: polyethylene glycol powder Take 8.5 g by mouth daily.   Prolia 60 MG/ML Sosy injection Generic drug: denosumab Inject 60 mg into the skin every 6 (six) months.   Restasis 0.05 % ophthalmic emulsion Generic drug: cycloSPORINE Place 1 drop into both eyes 2 (two) times  daily.   zolpidem 10 MG tablet Commonly known as: AMBIEN Take 3.3 mg by mouth at bedtime as needed for sleep.        Follow-up Information     Clarene Essex, MD Follow up in 1 week(s).   Specialty: Gastroenterology Contact information: [id number] [place] [address] [phone]         Clarene Essex, MD Follow up in 1 week(s).   Specialty: Gastroenterology Contact information: [id number] [place] [address] [phone]                No Known Allergies  The results of significant diagnostics from this hospitalization (including imaging, microbiology, ancillary and laboratory) are listed below for reference.    Microbiology: No results found for this or any previous visit (from the past 240 hour(s)).  Procedures/Studies: US Abdomen Limited  Result Date: 11/19/2021 CLINICAL DATA:  Pancreatitis.  Evaluate for gallstones. EXAM: ULTRASOUND ABDOMEN LIMITED RIGHT UPPER QUADRANT COMPARISON:  CT abdomen and pelvis 11/18/2021  FINDINGS: Gallbladder: There is echogenic nonshadowing sludge within the gallbladder. No gallstones no pericholecystic fluid, gallbladder wall thickening, or sonographic Murphy's sign. Common bile duct: Diameter: 2.0 mm within normal limits. No intrahepatic or extrahepatic biliary ductal dilatation. Liver: No focal lesion identified. Within normal limits in parenchymal echogenicity. Portal vein is patent on color Doppler imaging with normal direction of blood flow towards the liver. Other: Minimal ascites. IMPRESSION: Gallbladder sludge but no gallstones. No sonographic evidence of acute cholecystitis. Electronically Signed   By: Yvonne Kendall M.D.   On: 11/19/2021 11:30   CT ABDOMEN PELVIS W CONTRAST  Result Date: 11/18/2021 CLINICAL DATA:  Abdominal pain, pancreatitis EXAM: CT ABDOMEN AND PELVIS WITH CONTRAST TECHNIQUE: Multidetector CT imaging of the abdomen and pelvis was performed using the standard protocol  following bolus administration of intravenous contrast. RADIATION DOSE REDUCTION: This exam was performed according to the departmental dose-optimization program which includes automated exposure control, adjustment of the mA and/or kV according to patient size and/or use of iterative reconstruction technique. CONTRAST:  47mL OMNIPAQUE IOHEXOL 300 MG/ML  SOLN COMPARISON:  None Available. FINDINGS: Lower chest: Lung bases are clear. Hepatobiliary: Liver is within normal limits. Gallbladder is unremarkable. No intrahepatic or extrahepatic ductal dilatation. Pancreas: Peripancreatic inflammatory changes/fluid inferiorly along the pancreatic head and anterior aspect of the uncinate process, suggesting acute pancreatitis. No pancreatic ductal dilatation. No drainable fluid collection/walled-off necrosis. Spleen: Within normal limits. Adrenals/Urinary Tract: Adrenal glands are within normal limits. Kidneys are within normal limits.  No hydronephrosis. Bladder is underdistended but unremarkable. Stomach/Bowel: Stomach is within normal limits. Inflammatory changes along the 2nd and 3rd portion of the duodenum (series 2/image 33), suggesting duodenitis. Mildly prominent loops of small bowel in the left mid abdomen (series 2/image 38) likely reflect adynamic ileus. Status post appendectomy. Secondary inflammatory changes involving the hepatic flexure of the colon (series 2/image 36). Vascular/Lymphatic: No evidence of abdominal aortic aneurysm. No suspicious abdominopelvic lymphadenopathy. Reproductive: Status post hysterectomy. Left ovary is within normal limits.  No right adnexal mass. Other: Small volume pelvic ascites. Musculoskeletal: Status post PLIF at L5-S1 with grade 2 spondylolisthesis. Mild degenerative changes at L4-5. IMPRESSION: Acute pancreatitis. No drainable fluid collection/walled-off necrosis. Secondary inflammatory changes involving the duodenum and hepatic flexure of the colon. Electronically Signed   By:  Julian Hy M.D.   On: 11/18/2021 23:49    Labs: BNP (last 3 results) No results for input(s): "BNP" in the last 8760 hours. Basic Metabolic Panel: Recent Labs  Lab 11/19/21 0334 11/20/21 0326 11/21/21 0324 11/22/21 0340 11/23/21 0348  NA 141 141 142 143 141  K 3.4* 3.5 3.0* 3.2* 3.4*  CL 109 113* 115* 114* 112*  CO2 25 21* 21* 23 22  GLUCOSE 151* 88 157* 126* 123*  BUN 11 8 8  <5* <5*  CREATININE 0.63 0.60 0.51 0.55 0.45  CALCIUM 7.9* 7.0* 6.8* 7.5* 7.5*  MG  --  2.1 2.5*  --   --    Liver Function Tests: Recent Labs  Lab 11/18/21 2102 11/19/21 0334  AST 15 16  ALT 13 15  ALKPHOS 24* 22*  BILITOT 1.0 0.8  PROT 7.1 6.0*  ALBUMIN 4.7 3.5   Recent Labs  Lab 11/18/21 2102 11/20/21 0326  LIPASE 1,141* 85*   No results for input(s): "AMMONIA" in the last 168 hours. CBC: Recent Labs  Lab 11/19/21 0334 11/20/21 0326 11/21/21 0324 11/22/21 0340 11/23/21 0348  WBC 17.1* 20.7* 16.2* 9.6 8.3  NEUTROABS  --  18.6*  --   --   --  HGB 12.8 12.1 10.3* 10.3* 9.7*  HCT 38.9 37.9 32.2* 32.0* 29.4*  MCV 94.2 96.4 97.3 95.2 92.7  PLT 151 149* 133* 140* 155   Cardiac Enzymes: No results for input(s): "CKTOTAL", "CKMB", "CKMBINDEX", "TROPONINI" in the last 168 hours. BNP: Invalid input(s): "POCBNP" CBG: No results for input(s): "GLUCAP" in the last 168 hours. D-Dimer No results for input(s): "DDIMER" in the last 72 hours. Hgb A1c No results for input(s): "HGBA1C" in the last 72 hours. Lipid Profile No results for input(s): "CHOL", "HDL", "LDLCALC", "TRIG", "CHOLHDL", "LDLDIRECT" in the last 72 hours. Thyroid function studies No results for input(s): "TSH", "T4TOTAL", "T3FREE", "THYROIDAB" in the last 72 hours.  Invalid input(s): "FREET3" Anemia work up No results for input(s): "VITAMINB12", "FOLATE", "FERRITIN", "TIBC", "IRON", "RETICCTPCT" in the last 72 hours. Urinalysis    Component Value Date/Time   COLORURINE YELLOW 11/18/2021 2102   APPEARANCEUR  CLEAR 11/18/2021 2102   LABSPEC 1.023 11/18/2021 2102   PHURINE 7.0 11/18/2021 2102   GLUCOSEU NEGATIVE 11/18/2021 2102   HGBUR NEGATIVE 11/18/2021 2102   BILIRUBINUR NEGATIVE 11/18/2021 2102   KETONESUR 40 (A) 11/18/2021 2102   PROTEINUR 30 (A) 11/18/2021 2102   NITRITE NEGATIVE 11/18/2021 2102   LEUKOCYTESUR NEGATIVE 11/18/2021 2102   Sepsis Labs Recent Labs  Lab 11/20/21 0326 11/21/21 0324 11/22/21 0340 11/23/21 0348  WBC 20.7* 16.2* 9.6 8.3   Microbiology No results found for this or any previous visit (from the past 240 hour(s)).   Time coordinating discharge: 25 minutes  SIGNED: Lanae Boast, MD  [facility] 11/23/2021, 1:10 PM  If 7PM-7AM, please contact night-coverage [email or web address]

## 2021-11-23 NOTE — Progress Notes (Signed)
Alexis Lin 9:30 AM  Subjective: Patient continues to improve still with some abdominal pain but not requiring narcotics and eating a little and moving her bowels and no nausea or vomiting and no new complaints Case discussed with hospital team  Objective: Vital signs stable afebrile no acute distress in okay spirits minimal midepigastric discomfort decreased tenderness and softer elsewhere still some distention White count decreased hemoglobin slight decrease chemistries okay  Assessment: Pancreatitis improved okay with me to go home  Plan: I will see her back next week she will call me as needed we discussed low-fat diet and setting up MRCP as an outpatient as well as possibly using pancreatic enzymes and going up or down on MiraLAX as needed and hospitalist asked dietitian to talk to her about low-fat diet before discharge and please call us if we can be of any further assistance with this hospital stay  [facility]  office [phone] After 5PM or if no answer call [phone]

## 2021-11-29 ENCOUNTER — Other Ambulatory Visit (HOSPITAL_COMMUNITY): Payer: Self-pay

## 2021-12-07 ENCOUNTER — Other Ambulatory Visit: Payer: Self-pay | Admitting: Gastroenterology

## 2021-12-07 DIAGNOSIS — K859 Acute pancreatitis without necrosis or infection, unspecified: Secondary | ICD-10-CM

## 2021-12-20 ENCOUNTER — Ambulatory Visit
Admission: RE | Admit: 2021-12-20 | Discharge: 2021-12-20 | Disposition: A | Discharge status: Home / Self Care | Payer: BC Managed Care – PPO | Source: Ambulatory Visit | Attending: Gastroenterology | Admitting: Gastroenterology

## 2021-12-20 DIAGNOSIS — K859 Acute pancreatitis without necrosis or infection, unspecified: Secondary | ICD-10-CM

## 2021-12-20 MED ORDER — GADOBENATE DIMEGLUMINE 529 MG/ML IV SOLN
10.0000 mL | Freq: Once | INTRAVENOUS | Status: AC | PRN
  Administered 2021-12-20: 10 mL via INTRAVENOUS

## 2022-05-08 ENCOUNTER — Ambulatory Visit: Payer: BC Managed Care – PPO | Admitting: Cardiology

## 2022-05-22 ENCOUNTER — Encounter: Payer: Self-pay | Admitting: Cardiology

## 2022-05-23 ENCOUNTER — Encounter: Payer: Self-pay | Admitting: Cardiology

## 2022-05-23 ENCOUNTER — Ambulatory Visit: Payer: BC Managed Care – PPO | Admitting: Cardiology

## 2022-05-23 VITALS — BP 145/85 | HR 93 | Resp 16 | Ht 61.5 in | Wt 104.0 lb

## 2022-05-23 DIAGNOSIS — Z8249 Family history of ischemic heart disease and other diseases of the circulatory system: Secondary | ICD-10-CM

## 2022-05-23 DIAGNOSIS — E7800 Pure hypercholesterolemia, unspecified: Secondary | ICD-10-CM

## 2022-05-23 DIAGNOSIS — Z0000 Encounter for general adult medical examination without abnormal findings: Secondary | ICD-10-CM

## 2022-05-23 DIAGNOSIS — K859 Acute pancreatitis without necrosis or infection, unspecified: Secondary | ICD-10-CM

## 2022-05-23 NOTE — Progress Notes (Signed)
Primary Physician/Referring:  Chesley Noon, MD  Patient ID: Alexis Lin, female    DOB: Jul 27, 1960, 62 y.o.   MRN: [id number]  Chief Complaint  Patient presents with   Palpitations   New Patient (Initial Visit)   HPI:    Alexis Lin  is a 62 y.o. Patient with history of anxiety, family history of abdominal aortic aneurysm in her mother, chronic palpitations, last hospitalization was on 11/18/2021 with acute pancreatitis has h/o prior pancreatitis, felt to be due to alcohol, was also evaluated at [facility] on 08/10/2016,  due to elevated ferritin felt to be due to heavy alcohol consumption, referred to me by Clarene Essex for ardiac risk stratification.  Patient although drinks about 2 to 3 glasses of wine a day but not on a daily basis, when she was admitted recently with pancreatitis, she has not had any alcoholic drinks for a month.  She is presently asymptomatic, denies any palpitations, denies dyspnea, chest pain.  She is concerned about AAA and also wanted to establish a baseline from cardiac standpoint.  Past Medical History:  Diagnosis Date   Ejection fraction    60-65%, echo, August 01, 2010   Family history of aneurysm    The patient's mother had an ascending aortic aneurysm.  There was no specific pathology.  The aortic valve was effected .   Insomnia    Mitral click    Late systolic click on physical exam, April, 2012,.  There is no murmur.  There is no mitral valve prolapse by echo   Palpitations    April, 2012   Panic attacks    2012   Venous disease    Very slight superficial venous changes at the ankle.. No significant abnormality   Past Surgical History:  Procedure Laterality Date   APPENDECTOMY     CESAREAN SECTION  05/01/1983   hysterectomy unknown type  04/30/1993   LUMBAR LAMINECTOMY/DECOMPRESSION MICRODISCECTOMY Left 11/28/2017   Procedure: Left Lumbar Two-Three extraforaminal microdiscectomy;  Surgeon: Jovita Gamma, MD;  Location: [facility];  Service:  Neurosurgery;  Laterality: Left;  Left Lumbar Two-Three extraforaminal microdiscectomy   TONSILLECTOMY  04/30/1961   Family History  Problem Relation Age of Onset   Hypertension Mother    Heart disease Mother    Aneurysm Other    Hypertension Other     Social History   Tobacco Use   Smoking status: Never   Smokeless tobacco: Never  Substance Use Topics   Alcohol use: Yes    Comment: socially   Marital Status: Married  ROS  Review of Systems  Cardiovascular:  Negative for chest pain, dyspnea on exertion and leg swelling.   Objective      05/23/2022    9:11 AM 11/23/2021    9:33 AM 11/23/2021    5:52 AM  Vitals with BMI  Height 5' 1.5"    Weight 104 lbs    BMI 32.35    Systolic 573 220 254  Diastolic 85 94 89  Pulse 93 84 86   SpO2: 98 %  Physical Exam Neck:     Vascular: No carotid bruit or JVD.  Cardiovascular:     Rate and Rhythm: Normal rate and regular rhythm.     Pulses: Intact distal pulses.     Heart sounds: Normal heart sounds. No murmur heard.    No gallop.  Pulmonary:     Effort: Pulmonary effort is normal.     Breath sounds: Normal breath sounds.  Abdominal:  General: Bowel sounds are normal.     Palpations: Abdomen is soft.  Musculoskeletal:     Right lower leg: No edema.     Left lower leg: No edema.     Medications and allergies  No Known Allergies   Medication list   Current Outpatient Medications:    ALPRAZolam (XANAX) 1 MG tablet, Take 1 mg by mouth at bedtime as needed., Disp: , Rfl:    CALCIUM PO, Take 2 tablets by mouth daily., Disp: , Rfl:    [employer] PURELAX 17 GM/SCOOP powder, Take 8.5 g by mouth daily., Disp: , Rfl:    denosumab (PROLIA) 60 MG/ML SOSY injection, Inject 60 mg into the skin every 6 (six) months., Disp: , Rfl:    escitalopram (LEXAPRO) 10 MG tablet, Take 5 mg by mouth daily., Disp: , Rfl:    gabapentin (NEURONTIN) 300 MG capsule, Take 300 mg by mouth at bedtime., Disp: , Rfl:    meloxicam (MOBIC) 15 MG tablet,  Take 15 mg by mouth daily., Disp: , Rfl:    Multiple Vitamins-Minerals (ONE-A-DAY WOMENS VITACRAVES) CHEW, Chew 2 tablets by mouth daily., Disp: , Rfl:    polyethylene glycol (MIRALAX / GLYCOLAX) packet, Take 8.5 g by mouth daily. (Patient not taking: Reported on 11/19/2021), Disp: , Rfl:    RESTASIS 0.05 % ophthalmic emulsion, Place 1 drop into both eyes 2 (two) times daily., Disp: , Rfl:  Laboratory examination:   Recent Labs    11/21/21 0324 11/22/21 0340 11/23/21 0348  NA 142 143 141  K 3.0* 3.2* 3.4*  CL 115* 114* 112*  CO2 21* 23 22  GLUCOSE 157* 126* 123*  BUN 8 <5* <5*  CREATININE 0.51 0.55 0.45  CALCIUM 6.8* 7.5* 7.5*  GFRNONAA >60 >60 >60    Lab Results  Component Value Date   GLUCOSE 123 (H) 11/23/2021   NA 141 11/23/2021   K 3.4 (L) 11/23/2021   CL 112 (H) 11/23/2021   CO2 22 11/23/2021   BUN <5 (L) 11/23/2021   CREATININE 0.45 11/23/2021   GFRNONAA >60 11/23/2021   CALCIUM 7.5 (L) 11/23/2021   PROT 6.0 (L) 11/19/2021   ALBUMIN 3.5 11/19/2021   BILITOT 0.8 11/19/2021   ALKPHOS 22 (L) 11/19/2021   AST 16 11/19/2021   ALT 15 11/19/2021   ANIONGAP 7 11/23/2021      Lab Results  Component Value Date   ALT 15 11/19/2021   AST 16 11/19/2021   ALKPHOS 22 (L) 11/19/2021   BILITOT 0.8 11/19/2021       Latest Ref Rng & Units 11/19/2021    3:34 AM 11/18/2021    9:02 PM  Hepatic Function  Total Protein 6.5 - 8.1 g/dL 6.0  7.1   Albumin 3.5 - 5.0 g/dL 3.5  4.7   AST 15 - 41 U/L 16  15   ALT 0 - 44 U/L 15  13   Alk Phosphatase 38 - 126 U/L 22  24   Total Bilirubin 0.3 - 1.2 mg/dL 0.8  1.0     Lipid Panel Recent Labs    11/19/21 0334 05/23/22 1000  CHOL  --  334*  TRIG 33 121  LDLCALC  --  215*  HDL  --  98   Labs 05/23/2022:  Amylase 118.  Lipase moderately elevated at 119.  6 months ago on 11/20/2021 was 85 and on 11/18/2021 it was 1141.   HEMOGLOBIN A1C No results found for: "HGBA1C", "MPG"  Lab Results  Component Value Date  TSH 1.17  08/16/2010    External labs:   Labs 05/09/2009: Total cholesterol 236, triglycerides 54, HDL 94, LDL 131.  Radiology:   MRI of the abdomen 12/20/2021: Vascular/Lymphatic: No pathologically enlarged lymph nodes identified. No abdominal aortic aneurysm demonstrated. 2. Currently there are features of prior pancreatitis without acute inflammation. Normal liver.   Cardiac Studies:   Lower Extremity Arterial Duplex 01/22/2011: Findings: Ankle brachial indices are greater than one bilaterally.  Triphasic wave forms are seen throughout both lower extremities.   EKG:   EKG 05/23/2022: Normal sinus rhythm at the rate of 75 bpm, left atrial enlargement, incomplete right bundle branch block, otherwise normal EKG.    Assessment     ICD-10-CM   1. Family history of abdominal aortic aneurysm (AAA)  Z82.49 EKG 12-Lead    Lipid Panel With LDL/HDL Ratio    2. Acute recurrent pancreatitis  K85.90 High sensitivity CRP    Amylase    Lipase    3. Annual physical exam  Z00.00 Lipid Panel With LDL/HDL Ratio    4. Hypercholesteremia  E78.00 CT CARDIAC SCORING ([facility] LOCATIONS ONLY)       Orders Placed This Encounter  Procedures   CT CARDIAC SCORING ([facility] LOCATIONS ONLY)    Standing Status:   Future    Standing Expiration Date:   07/23/2022    Order Specific Question:   Preferred imaging location?    Answer:   [facility]   Lipid Panel With LDL/HDL Ratio   High sensitivity CRP   Amylase   Lipase   EKG 12-Lead    No orders of the defined types were placed in this encounter.   Medications Discontinued During This Encounter  Medication Reason   omeprazole (PRILOSEC) 40 MG capsule Patient Preference   zolpidem (AMBIEN) 10 MG tablet Patient Preference   OVER THE COUNTER MEDICATION Patient Preference     Recommendations:   Alexis Lin is a 62 y.o. Patient with history of anxiety, family history of abdominal aortic aneurysm in her mother, chronic palpitations, last hospitalization was on  11/18/2021 with acute pancreatitis has h/o prior pancreatitis, felt to be due to alcohol, was also evaluated at [facility] on 08/10/2016,  due to elevated ferritin felt to be due to heavy alcohol consumption, referred to me by Vida Rigger for ardiac risk stratification.  1. Family history of abdominal aortic aneurysm (AAA) I have reassured the patient.  I reviewed the CT scans of the abdomen and also MRI of the abdomen, there is no evidence of AAA.  As she is 62 years of age, no further evaluation is indicated as she essentially has a normal abdominal aorta with no atherosclerosis, no ectasia.  I will obtain baseline lipid profile testing as it has been several years since prior testing.  2. Acute recurrent pancreatitis Although medical records indicate excess alcohol use, latest episode of pancreatitis occurred when she was not drinking any alcohol for >45-month.  I have discussed with her that alcohol can increase the risk of pancreatitis, she may also have an inherent predilection for recurrent idiopathic pancreatitis as well however to avoid risk of recurrence, marked reduction in alcohol intake was discussed.  I do not think she abuses alcohol.  I would like to obtain a baseline lipase and amylase as she is asymptomatic presently.  3. Annual physical exam To use annual physical exam diagnosis: For ordering labs.  Otherwise she is stable from cardiac standpoint, in the absence of hypertension, diabetes, tobacco use, obesity, no family history  of premature coronary disease, normal abdominal aorta without atherosclerosis or ectasia, no further evaluation is indicated.  I will see her back on a as needed basis.  I have reviewed the results of the lipid profile testing with Dr. Veronia Beets (PCP) and also Dr. Clarene Essex (GI).  In view of markedly elevated lipids, although her CV risk is very low, I will further stratify her by performing coronary calcium score.  If <50th percentile, I do not think she needs  statin therapy.    Adrian Prows, MD, [facility] 05/24/2022, 3:04 PM Office: [phone]

## 2022-05-24 LAB — LIPID PANEL WITH LDL/HDL RATIO
Cholesterol, Total: 334 mg/dL — ABNORMAL HIGH (ref 100–199)
HDL: 98 mg/dL (ref >39)
LDL Chol Calc (NIH): 215 mg/dL — ABNORMAL HIGH (ref 0–99)
LDL/HDL Ratio: 2.2 ratio (ref 0.0–3.2)
Triglycerides: 121 mg/dL (ref 0–149)
VLDL Cholesterol Cal: 21 mg/dL (ref 5–40)

## 2022-05-24 LAB — AMYLASE: Amylase: 118 U/L — ABNORMAL HIGH (ref 31–110)

## 2022-05-24 LAB — HIGH SENSITIVITY CRP: CRP, High Sensitivity: 0.56 mg/L (ref 0.00–3.00)

## 2022-05-24 LAB — LIPASE: Lipase: 119 U/L — ABNORMAL HIGH (ref 14–72)

## 2022-05-24 NOTE — Addendum Note (Signed)
Addended by: Kela Millin on: 05/24/2022 03:10 PM   Modules accepted: Orders

## 2022-06-21 ENCOUNTER — Ambulatory Visit
Admission: RE | Admit: 2022-06-21 | Discharge: 2022-06-21 | Disposition: A | Discharge status: Home / Self Care | Payer: No Typology Code available for payment source | Source: Ambulatory Visit | Attending: Cardiology | Admitting: Cardiology

## 2022-06-21 DIAGNOSIS — E7800 Pure hypercholesterolemia, unspecified: Secondary | ICD-10-CM

## 2022-06-21 NOTE — Progress Notes (Signed)
Coronary calcium score 06/21/2022: Total Agatston score 0.  Normal ascending and descending thoracic aortic measurements.  No significant extracardiac abnormality.

## 2023-03-21 ENCOUNTER — Ambulatory Visit: Payer: BC Managed Care – PPO | Admitting: Internal Medicine

## 2023-04-04 ENCOUNTER — Encounter: Payer: Self-pay | Admitting: Internal Medicine

## 2023-04-04 ENCOUNTER — Ambulatory Visit: Payer: BC Managed Care – PPO | Attending: Internal Medicine | Admitting: Internal Medicine

## 2023-04-04 ENCOUNTER — Telehealth: Payer: Self-pay | Admitting: Internal Medicine

## 2023-04-04 VITALS — BP 136/89 | HR 90 | Ht 61.0 in | Wt 107.0 lb

## 2023-04-04 DIAGNOSIS — E7801 Familial hypercholesterolemia: Secondary | ICD-10-CM | POA: Diagnosis not present

## 2023-04-04 DIAGNOSIS — E7800 Pure hypercholesterolemia, unspecified: Secondary | ICD-10-CM

## 2023-04-04 NOTE — Patient Instructions (Addendum)
Medication Instructions:  NO CHANGES today   *If you need a refill on your cardiac medications before your next appointment, please call your pharmacy*   Lab Work: FASTING NMR lipoprofile, LPa as soon as able at any [facility]  FASTING lab work in 4 months  ** complete about a week before next visit  If you have labs (blood work) drawn today and your tests are completely normal, you will receive your results only by: MyChart Message (if you have MyChart) OR A paper copy in the mail If you have any lab test that is abnormal or we need to change your treatment, we will call you to review the results.   Testing/Procedures: Genetic Test -- results available in 2-3 weeks Dr. Rennis Golden will reach out about the results in MyChart   Follow-Up: At [facility], you and your health needs are our priority.  As part of our continuing mission to provide you with exceptional heart care, we have created designated Provider Care Teams.  These Care Teams include your primary Cardiologist (physician) and Advanced Practice Providers (APPs -  Physician Assistants and Nurse Practitioners) who all work together to provide you with the care you need, when you need it.  We recommend signing up for the patient portal called "MyChart".  Sign up information is provided on this After Visit Summary.  MyChart is used to connect with patients for Virtual Visits (Telemedicine).  Patients are able to view lab/test results, encounter notes, upcoming appointments, etc.  Non-urgent messages can be sent to your provider as well.   To learn more about what you can do with MyChart, go to [email or web address].    Your next appointment:   4 months with Dr. Rennis Golden ** call in early Jan 2025 for next visit

## 2023-04-04 NOTE — Progress Notes (Signed)
LIPID CLINIC CONSULT NOTE  Chief Complaint:  Manage dyslipidemia  Primary Care Physician: Alexis Inch, MD  Primary Cardiologist:  None  HPI:  Alexis Lin is a 62 y.o. female who is being seen today for the evaluation of dyslipidemia at the request of Alexis Overlie, MD. This is a pleasant 62 year old female whose family owns [employer] and was referred by Alexis Lin for evaluation management of dyslipidemia.  She has a history of heart disease in the family including her mother who had aortic aneurysm and coronary artery disease in her maternal grand mother who had coronary artery disease.  She had previously seen Alexis Lin who did some workup including a coronary calcium score for her in 2022 which was 0.  She had labs however in January which does show a significantly elevated cholesterol.  She reports she has been aware of elevated cholesterol probably for the last 7 to 10 years however it has been progressively increasing.  Recent labs showed total cholesterol 334, HDL 98, LDL 215 and triglycerides 284.  While her LDL is quite high her HDL is also correspondingly high.  Is not clear whether this HDL was potentially cardioprotective however studies have shown that very high levels of HDL cholesterol oftentimes are not functional and more likely to be seen in patients with familial hyperlipidemia.  Besides this, there are few personal cardiovascular risk factors including no personal history of diabetes or hypertension.  She is active but does not regularly exercise.  She eats a varied diet but generally tries to avoid foods that are high in fat.  She is a non-smoker.  PMHx:  Past Medical History:  Diagnosis Date   Ejection fraction    60-65%, echo, August 01, 2010   Family history of aneurysm    The patient's mother had an ascending aortic aneurysm.  There was no specific pathology.  The aortic valve was effected .   Insomnia    Mitral click    Late systolic click on  physical exam, April, 2012,.  There is no murmur.  There is no mitral valve prolapse by echo   Palpitations    April, 2012   Panic attacks    2012   Venous disease    Very slight superficial venous changes at the ankle.. No significant abnormality    Past Surgical History:  Procedure Laterality Date   APPENDECTOMY     CESAREAN SECTION  05/01/1983   hysterectomy unknown type  04/30/1993   LUMBAR LAMINECTOMY/DECOMPRESSION MICRODISCECTOMY Left 11/28/2017   Procedure: Left Lumbar Two-Three extraforaminal microdiscectomy;  Surgeon: Alexis Kelly, MD;  Location: [facility] OR;  Service: Neurosurgery;  Laterality: Left;  Left Lumbar Two-Three extraforaminal microdiscectomy   TONSILLECTOMY  04/30/1961    FAMHx:  Family History  Problem Relation Age of Onset   Hypertension Mother    Heart disease Mother    Aneurysm Other    Hypertension Other     SOCHx:   reports that she has never smoked. She has never used smokeless tobacco. She reports current alcohol use. She reports that she does not use drugs.  ALLERGIES:  No Known Allergies  ROS: Pertinent items noted in HPI and remainder of comprehensive ROS otherwise negative.  HOME MEDS: Current Outpatient Medications on File Prior to Visit  Medication Sig Dispense Refill   ALPRAZolam (XANAX) 1 MG tablet Take 1 mg by mouth at bedtime as needed.     CALCIUM PO Take 2 tablets by mouth daily.  [employer] PURELAX 17 GM/SCOOP powder Take 8.5 g by mouth daily.     denosumab (PROLIA) 60 MG/ML SOSY injection Inject 60 mg into the skin every 6 (six) months.     escitalopram (LEXAPRO) 10 MG tablet Take 5 mg by mouth daily.     gabapentin (NEURONTIN) 300 MG capsule Take 300 mg by mouth at bedtime.     meloxicam (MOBIC) 15 MG tablet Take 15 mg by mouth daily.     Multiple Vitamins-Minerals (ONE-A-DAY WOMENS VITACRAVES) CHEW Chew 2 tablets by mouth daily.     polyethylene glycol (MIRALAX / GLYCOLAX) packet Take 8.5 g by mouth daily. (Patient not taking:  Reported on 11/19/2021)     RESTASIS 0.05 % ophthalmic emulsion Place 1 drop into both eyes 2 (two) times daily.     No current facility-administered medications on file prior to visit.    LABS/IMAGING: No results found for this or any previous visit (from the past 48 hour(s)). No results found.  LIPID PANEL:    Component Value Date/Time   CHOL 334 (H) 05/23/2022 1000   TRIG 121 05/23/2022 1000   HDL 98 05/23/2022 1000   LDLCALC 215 (H) 05/23/2022 1000    WEIGHTS: Wt Readings from Last 3 Encounters:  04/04/23 107 lb (48.5 kg)  05/23/22 104 lb (47.2 kg)  11/18/21 110 lb 14.3 oz (50.3 kg)    VITALS: BP 136/89 (BP Location: Left Arm, Patient Position: Sitting, Cuff Size: Normal)   Pulse 90   Ht 5\' 1"  (1.549 m)   Wt 107 lb (48.5 kg)   SpO2 90%   BMI 20.22 kg/m   EXAM: Deferred  EKG: Deferred  ASSESSMENT: Dyslipidemia with LDL greater than 190, possible familial hyperlipidemia High HDL cholesterol-? Nonfunctional 0 CAC score (2022) Family history of cardiovascular disease in her mother and maternal grandmother  PLAN: 1.   Alexis Lin had recent testing which shows a very high LDL cholesterol but also a high HDL cholesterol.  She had no coronary calcium by scoring in 2022 although has family history of heart disease.  There is been discussion about lipid-lowering therapy as her diet is already somewhat optimal.  She wanted to further understand her cardiovascular risk and was interested in genetic testing.  I think this is a good option for her given her very high LDL cholesterol to determine if she possibly has a pathogenic variant.  Will plan to proceed with genetic testing today.  She understands if this was for some reason not covered by insurance her out-of-pocket cost would be up to $299.  She is agreeable to the testing.  Will plan to contact her with those results.  Also like to update her lipids since her last testing was in January and will obtain an NMR and LP(a)  fasting within the next week or so.  Thanks as always for the kind referral.  Plan likely follow-up in about 3 to 4 months after we determine if additional therapy is necessary.  Chrystie Nose, MD, [facility], FACP  [place]  [facility]  Medical Director of the Advanced Lipid Disorders &  Cardiovascular Risk Reduction Clinic Diplomate of the American Board of Clinical Lipidology Attending Cardiologist  Direct Dial: [phone]  Fax: [phone]  Website:  [email or web address] 04/04/2023, 10:45 AM

## 2023-04-04 NOTE — Telephone Encounter (Signed)
Genetic test for dyslipidemia/ASCVD panel ordered ([facility]) Cheek swab completed in office Specimen and necessary paperwork mailed. ID: [id number]

## 2023-04-07 LAB — NMR, LIPOPROFILE
Cholesterol, Total: 340 mg/dL — ABNORMAL HIGH (ref 100–199)
HDL Particle Number: 47.9 umol/L (ref >=30.5)
HDL-C: 118 mg/dL (ref >39)
LDL Particle Number: 1907 nmol/L — ABNORMAL HIGH (ref <1000)
LDL Size: 22 nmol (ref >20.5)
LDL-C (NIH Calc): 211 mg/dL — ABNORMAL HIGH (ref 0–99)
LP-IR Score: <25 (ref <=45)
Small LDL Particle Number: <90 nmol/L (ref <=527)
Triglycerides: 77 mg/dL (ref 0–149)

## 2023-04-07 LAB — LIPOPROTEIN A (LPA): Lipoprotein (a): 56.8 nmol/L (ref <75.0)

## 2023-04-28 ENCOUNTER — Encounter: Payer: Self-pay | Admitting: Internal Medicine

## 2023-05-06 ENCOUNTER — Encounter: Payer: Self-pay | Admitting: Internal Medicine

## 2023-06-28 ENCOUNTER — Ambulatory Visit: Payer: BC Managed Care – PPO | Attending: Internal Medicine | Admitting: Internal Medicine

## 2023-06-28 ENCOUNTER — Encounter: Payer: Self-pay | Admitting: Internal Medicine

## 2023-06-28 VITALS — BP 120/80 | HR 92 | Ht 61.5 in | Wt 106.0 lb

## 2023-06-28 DIAGNOSIS — E7800 Pure hypercholesterolemia, unspecified: Secondary | ICD-10-CM

## 2023-06-28 MED ORDER — ROSUVASTATIN CALCIUM 20 MG PO TABS: 20.0000 mg | ORAL_TABLET | Freq: Every day | ORAL | 3 refills | Status: DC

## 2023-06-28 NOTE — Patient Instructions (Signed)
 Medication Instructions:  START rosuvastatin 20mg  daily  Lab Work: FASTING lab work in 3-4 months -- NMR lipoprofile   Follow-Up: At [facility], you and your health needs are our priority.  As part of our continuing mission to provide you with exceptional heart care, we have created designated Provider Care Teams.  These Care Teams include your primary Cardiologist (physician) and Advanced Practice Providers (APPs -  Physician Assistants and Nurse Practitioners) who all work together to provide you with the care you need, when you need it.  We recommend signing up for the patient portal called "MyChart".  Sign up information is provided on this After Visit Summary.  MyChart is used to connect with patients for Virtual Visits (Telemedicine).  Patients are able to view lab/test results, encounter notes, upcoming appointments, etc.  Non-urgent messages can be sent to your provider as well.   To learn more about what you can do with MyChart, go to [email or web address].    Your next appointment:   12 months with Dr. Rennis Golden or Eligha Bridegroom NP -- lipid clinic

## 2023-06-28 NOTE — Progress Notes (Signed)
 LIPID CLINIC CONSULT NOTE  Chief Complaint:  Manage dyslipidemia  Primary Care Physician: Eartha Inch, MD  Primary Cardiologist:  None  HPI:  Alexis Lin is a 63 y.o. female who is being seen today for the evaluation of dyslipidemia at the request of Eartha Inch, MD. This is a pleasant 63 year old female whose family owns [employer] and was referred by Dr. Vincente Poli for evaluation management of dyslipidemia.  She has a history of heart disease in the family including her mother who had aortic aneurysm and coronary artery disease in her maternal grand mother who had coronary artery disease.  She had previously seen Dr. Jacinto Halim who did some workup including a coronary calcium score for her in 2022 which was 0.  She had labs however in January which does show a significantly elevated cholesterol.  She reports she has been aware of elevated cholesterol probably for the last 7 to 10 years however it has been progressively increasing.  Recent labs showed total cholesterol 334, HDL 98, LDL 215 and triglycerides 161.  While her LDL is quite high her HDL is also correspondingly high.  Is not clear whether this HDL was potentially cardioprotective however studies have shown that very high levels of HDL cholesterol oftentimes are not functional and more likely to be seen in patients with familial hyperlipidemia.  Besides this, there are few personal cardiovascular risk factors including no personal history of diabetes or hypertension.  She is active but does not regularly exercise.  She eats a varied diet but generally tries to avoid foods that are high in fat.  She is a non-smoker.  06/28/2023  Alexis Lin is seen today in follow-up of her genetic testing.  Fortunately, this did not show any genetic variants that were considered pathogenic.  There were however some variants of unknown significance including 2 variants in the LPA gene however her LP(a) level is not elevated.  She had to  variations in the APO B gene which can be associated with high cholesterol but may or may not be associated with increased cardiovascular risk.  There are also 3 variants associated with increased risk of triglycerides although hers have been normal and 1 variant suggested increase HDL levels.  Overall despite the fact that she had no detectable coronary disease by coronary calcium scoring, her cholesterol remains very high and I would advise therapy for lipid lowering.  She is in agreement with that however does have concerns about the statins particular because she has chronic back pain issues and she is concerned that they may worsen them.  PMHx:  Past Medical History:  Diagnosis Date   Ejection fraction    60-65%, echo, August 01, 2010   Family history of aneurysm    The patient's mother had an ascending aortic aneurysm.  There was no specific pathology.  The aortic valve was effected .   Insomnia    Mitral click    Late systolic click on physical exam, April, 2012,.  There is no murmur.  There is no mitral valve prolapse by echo   Palpitations    April, 2012   Panic attacks    2012   Venous disease    Very slight superficial venous changes at the ankle.. No significant abnormality    Past Surgical History:  Procedure Laterality Date   APPENDECTOMY     CESAREAN SECTION  05/01/1983   hysterectomy unknown type  04/30/1993   LUMBAR LAMINECTOMY/DECOMPRESSION MICRODISCECTOMY Left 11/28/2017   Procedure: Left  Lumbar Two-Three extraforaminal microdiscectomy;  Surgeon: Shirlean Kelly, MD;  Location: [facility] OR;  Service: Neurosurgery;  Laterality: Left;  Left Lumbar Two-Three extraforaminal microdiscectomy   TONSILLECTOMY  04/30/1961    FAMHx:  Family History  Problem Relation Age of Onset   Hypertension Mother    Heart disease Mother    Aneurysm Other    Hypertension Other     SOCHx:   reports that she has never smoked. She has never used smokeless tobacco. She reports current alcohol  use. She reports that she does not use drugs.  ALLERGIES:  No Known Allergies  ROS: Pertinent items noted in HPI and remainder of comprehensive ROS otherwise negative.  HOME MEDS: Current Outpatient Medications on File Prior to Visit  Medication Sig Dispense Refill   CALCIUM PO Take 2 tablets by mouth daily.     [employer] PURELAX 17 GM/SCOOP powder Take 8.5 g by mouth daily.     denosumab (PROLIA) 60 MG/ML SOSY injection Inject 60 mg into the skin every 6 (six) months.     escitalopram (LEXAPRO) 10 MG tablet Take 5 mg by mouth daily.     gabapentin (NEURONTIN) 300 MG capsule Take 300 mg by mouth at bedtime.     meloxicam (MOBIC) 15 MG tablet Take 15 mg by mouth daily.     Multiple Vitamins-Minerals (ONE-A-DAY WOMENS VITACRAVES) CHEW Chew 2 tablets by mouth daily.     RESTASIS 0.05 % ophthalmic emulsion Place 1 drop into both eyes 2 (two) times daily.     ALPRAZolam (XANAX) 1 MG tablet Take 1 mg by mouth at bedtime as needed. (Patient not taking: Reported on 06/28/2023)     polyethylene glycol (MIRALAX / GLYCOLAX) packet Take 8.5 g by mouth daily. (Patient not taking: Reported on 11/19/2021)     No current facility-administered medications on file prior to visit.    LABS/IMAGING: No results found for this or any previous visit (from the past 48 hours). No results found.  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 Nonfunctional 0 CAC score (2022) Family history of cardiovascular disease in her mother and maternal grandmother  PLAN: 1.   Alexis Lin has persistently elevated cholesterol and by guidelines should be on high intensity statin therapy with 50% lipid lowering.  I would advise starting rosuvastatin 20 mg daily.  She should monitor for side effects including worsening myalgias or of her low back pain.  She is encouraged to reach out to me if this happens and we will plan a statin holiday, otherwise continue on the medication with repeat lipids in about 3 to 4 months and I will contact her with those results and any further changes if necessary.  Chrystie Nose, MD, [facility], FACP    [facility]  Medical  Director of the Advanced Lipid Disorders &  Cardiovascular Risk Reduction Clinic Diplomate of the American Board of Clinical Lipidology Attending Cardiologist  Direct Dial: [phone]  Fax: [phone]  Website:  [email or web address] 06/28/2023, 10:24 AM

## 2023-10-15 LAB — NMR, LIPOPROFILE
Cholesterol, Total: 230 mg/dL — ABNORMAL HIGH (ref 100–199)
HDL Particle Number: 50.2 umol/L (ref >=30.5)
HDL-C: 108 mg/dL (ref >39)
LDL Particle Number: 862 nmol/L (ref <1000)
LDL Size: 21.3 nm (ref >20.5)
LDL-C (NIH Calc): 109 mg/dL — ABNORMAL HIGH (ref 0–99)
LP-IR Score: <25 (ref <=45)
Small LDL Particle Number: <90 nmol/L (ref <=527)
Triglycerides: 78 mg/dL (ref 0–149)

## 2023-10-18 ENCOUNTER — Ambulatory Visit (HOSPITAL_BASED_OUTPATIENT_CLINIC_OR_DEPARTMENT_OTHER): Payer: Self-pay | Admitting: Internal Medicine

## 2024-06-01 ENCOUNTER — Other Ambulatory Visit: Payer: Self-pay | Admitting: Internal Medicine

## 2024-06-03 NOTE — Telephone Encounter (Signed)
 Lipids Completed on 10/14/23
